# Patient Record
Sex: Female | Born: 1980
Health system: Southern US, Community
[De-identification: ages and names within clinical notes are randomized; demographics above are authoritative.]

## PROBLEM LIST (undated history)

## (undated) DIAGNOSIS — E7800 Pure hypercholesterolemia, unspecified: Secondary | ICD-10-CM

## (undated) DIAGNOSIS — G43909 Migraine, unspecified, not intractable, without status migrainosus: Secondary | ICD-10-CM

## (undated) DIAGNOSIS — M779 Enthesopathy, unspecified: Secondary | ICD-10-CM

## (undated) DIAGNOSIS — K219 Gastro-esophageal reflux disease without esophagitis: Secondary | ICD-10-CM

## (undated) DIAGNOSIS — F419 Anxiety disorder, unspecified: Secondary | ICD-10-CM

## (undated) HISTORY — PX: CHOLECYSTECTOMY: SHX55

## (undated) HISTORY — PX: TUBAL LIGATION: SHX77

---

## 2012-10-15 ENCOUNTER — Ambulatory Visit (INDEPENDENT_AMBULATORY_CARE_PROVIDER_SITE_OTHER): Payer: Self-pay

## 2012-10-15 ENCOUNTER — Ambulatory Visit (INDEPENDENT_AMBULATORY_CARE_PROVIDER_SITE_OTHER): Payer: BC Managed Care – PPO | Admitting: Neurology

## 2012-10-15 DIAGNOSIS — R209 Unspecified disturbances of skin sensation: Secondary | ICD-10-CM

## 2012-10-15 DIAGNOSIS — R202 Paresthesia of skin: Secondary | ICD-10-CM | POA: Insufficient documentation

## 2012-10-15 NOTE — Procedures (Signed)
    [facility]  NCS (NERVE CONDUCTION STUDY) WITH EMG (ELECTROMYOGRAPHY) REPORT   STUDY DATE: Sept 25th 2014 PATIENT NAME: Samantha Fox DOB: 10-22-1980 MRN: [id number]    TECHNOLOGIST: Gearldine Shown  ELECTROMYOGRAPHER: Levert Feinstein M.D.  CLINICAL INFORMATION:  32 years old right-handed Caucasian female, presenting with 2 years history of intermittent right arm, bilateral lower extremity paresthesia.  FINDINGS: NERVE CONDUCTION STUDY: Bilateral ulnar, median sensory and motor responses were normal. Right peroneal to EDB, tibial motor responses were normal.  Right peroneal sensory response was normal.  NEEDLE ELECTROMYOGRAPHY:   Selected needle examination was performed at right upper and lower extremity muscles, right cervical, lumbosacral paraspinal muscles.  Needle examination of right extensor digitorum communis, biceps, triceps, deltoid was normal. There was no spontaneous activity at the right cervical paraspinal muscles, right C5, 6, 7.  Selected needle examination of right lower extremity muscles, right tibialis anterior, tibialis posterior, medial gastrocnemius, vastus lateralis, biceps femoris long head was normal   There was no spontaneous activity at right lumbosacral paraspinal muscles, right L4, L5, S1   IMPRESSION:   This is a normal study. There is no electrodiagnostic evidence of length dependent sensory changes, there is no evidence of right cervical, or right lumbosacral radiculopathy. There is no evidence of myopathy    INTERPRETING PHYSICIAN:   Levert Feinstein M.D. Ph.D. [facility] [address] [place], [place] [address] [phone]

## 2013-03-05 DIAGNOSIS — G8929 Other chronic pain: Secondary | ICD-10-CM | POA: Insufficient documentation

## 2013-03-05 DIAGNOSIS — R9082 White matter disease, unspecified: Secondary | ICD-10-CM | POA: Insufficient documentation

## 2015-05-18 ENCOUNTER — Emergency Department (HOSPITAL_COMMUNITY)
Admission: EM | Admit: 2015-05-18 | Discharge: 2015-05-19 | Disposition: A | Discharge status: Home / Self Care | Payer: 59 | Attending: Emergency Medicine | Admitting: Emergency Medicine

## 2015-05-18 ENCOUNTER — Encounter (HOSPITAL_COMMUNITY): Payer: Self-pay | Admitting: Emergency Medicine

## 2015-05-18 DIAGNOSIS — R112 Nausea with vomiting, unspecified: Secondary | ICD-10-CM | POA: Diagnosis not present

## 2015-05-18 DIAGNOSIS — R52 Pain, unspecified: Secondary | ICD-10-CM

## 2015-05-18 DIAGNOSIS — R1031 Right lower quadrant pain: Secondary | ICD-10-CM | POA: Insufficient documentation

## 2015-05-18 HISTORY — DX: Anxiety disorder, unspecified: F41.9

## 2015-05-18 HISTORY — DX: Enthesopathy, unspecified: M77.9

## 2015-05-18 HISTORY — DX: Gastro-esophageal reflux disease without esophagitis: K21.9

## 2015-05-18 HISTORY — DX: Pure hypercholesterolemia, unspecified: E78.00

## 2015-05-18 HISTORY — DX: Migraine, unspecified, not intractable, without status migrainosus: G43.909

## 2015-05-18 LAB — URINALYSIS, ROUTINE W REFLEX MICROSCOPIC
Bilirubin Urine: NEGATIVE
Glucose, UA: NEGATIVE mg/dL
Hgb urine dipstick: NEGATIVE
LEUKOCYTES UA: NEGATIVE
NITRITE: NEGATIVE
PH: 6 (ref 5.0–8.0)
Protein, ur: NEGATIVE mg/dL
Specific Gravity, Urine: <1.005 — ABNORMAL LOW (ref 1.005–1.030)

## 2015-05-18 LAB — CBC
HEMATOCRIT: 39.6 % (ref 36.0–46.0)
HEMOGLOBIN: 13.3 g/dL (ref 12.0–15.0)
MCH: 29.2 pg (ref 26.0–34.0)
MCHC: 33.6 g/dL (ref 30.0–36.0)
MCV: 87 fL (ref 78.0–100.0)
Platelets: 359 10*3/uL (ref 150–400)
RBC: 4.55 MIL/uL (ref 3.87–5.11)
RDW: 12.6 % (ref 11.5–15.5)
WBC: 12.3 10*3/uL — AB (ref 4.0–10.5)

## 2015-05-18 LAB — POC URINE PREG, ED: Preg Test, Ur: NEGATIVE

## 2015-05-18 NOTE — ED Notes (Signed)
Patient complaining of lower abdominal pain radiating into her back and vomiting starting at 1800 tonight.

## 2015-05-19 ENCOUNTER — Emergency Department (HOSPITAL_COMMUNITY): Payer: 59

## 2015-05-19 LAB — COMPREHENSIVE METABOLIC PANEL
ALT: 22 U/L (ref 14–54)
ANION GAP: 12 (ref 5–15)
AST: 26 U/L (ref 15–41)
Albumin: 4.7 g/dL (ref 3.5–5.0)
Alkaline Phosphatase: 80 U/L (ref 38–126)
BILIRUBIN TOTAL: 0.5 mg/dL (ref 0.3–1.2)
BUN: 7 mg/dL (ref 6–20)
CO2: 27 mmol/L (ref 22–32)
Calcium: 9.1 mg/dL (ref 8.9–10.3)
Chloride: 99 mmol/L — ABNORMAL LOW (ref 101–111)
Creatinine, Ser: 0.88 mg/dL (ref 0.44–1.00)
Glucose, Bld: 101 mg/dL — ABNORMAL HIGH (ref 65–99)
Potassium: 3.3 mmol/L — ABNORMAL LOW (ref 3.5–5.1)
Sodium: 138 mmol/L (ref 135–145)
TOTAL PROTEIN: 7.7 g/dL (ref 6.5–8.1)

## 2015-05-19 LAB — LIPASE, BLOOD: Lipase: 20 U/L (ref 11–51)

## 2015-05-19 MED ORDER — ONDANSETRON 8 MG PO TBDP: ORAL_TABLET | ORAL | Status: DC

## 2015-05-19 MED ORDER — IOPAMIDOL (ISOVUE-300) INJECTION 61%
100.0000 mL | Freq: Once | INTRAVENOUS | Status: AC | PRN
  Administered 2015-05-19: 100 mL via INTRAVENOUS

## 2015-05-19 MED ORDER — ONDANSETRON HCL 4 MG/2ML IJ SOLN
4.0000 mg | Freq: Once | INTRAMUSCULAR | Status: AC
  Administered 2015-05-19: 4 mg via INTRAVENOUS
  Filled 2015-05-19: qty 2

## 2015-05-19 MED ORDER — MORPHINE SULFATE (PF) 4 MG/ML IV SOLN
4.0000 mg | Freq: Once | INTRAVENOUS | Status: AC
  Administered 2015-05-19: 4 mg via INTRAVENOUS
  Filled 2015-05-19: qty 1

## 2015-05-19 NOTE — Discharge Instructions (Signed)

## 2015-05-19 NOTE — ED Provider Notes (Signed)
CSN: [id number]     Arrival date & time 05/18/15  2250 History  By signing my name below, I, Tanda Rockers, attest that this documentation has been prepared under the direction and in the presence of Zadie Rhine, MD. Electronically Signed: Tanda Rockers, ED Scribe. 05/19/2015. 12:46 AM.   Chief Complaint  Patient presents with  . Abdominal Pain  . Emesis   Patient is a 35 y.o. female presenting with abdominal pain and vomiting. The history is provided by the patient. No language interpreter was used.  Abdominal Pain Pain location:  LLQ and RLQ Pain radiates to:  Back Pain severity:  Moderate Onset quality:  Gradual Duration:  7 hours Timing:  Constant Progression:  Worsening Chronicity:  New Relieved by:  None tried Worsened by:  Nothing tried Ineffective treatments:  None tried Associated symptoms: nausea and vomiting   Associated symptoms: no chest pain, no cough, no diarrhea, no dysuria, no fever, no shortness of breath, no vaginal bleeding and no vaginal discharge   Emesis Associated symptoms: abdominal pain   Associated symptoms: no diarrhea      HPI Comments: Samantha Fox is a 35 y.o. female who presents to the Emergency Department complaining of gradual onset, constant, lower abdominal pain, worse on right, radiating to back that began earlier tonight around 6 PM (approximately 7 hours ago), gradually worsening. Pt also complains of nausea and vomiting. She has never had symptoms like this in the past. No previous issues with ovaries. PSHx cholecystectomy.  Denies fever, diarrhea, dysuria, vaginal bleeding, vaginal discharge, chest pain, cough, shortness of breath, or any other associated symptoms.    She has no concerns for any exposure to STD Past Medical History  Diagnosis Date  . Anxiety   . Migraines   . High cholesterol   . Acid reflux   . Tendonitis    Past Surgical History  Procedure Laterality Date  . Cholecystectomy    . Tubal ligation      History reviewed. No pertinent family history. Social History  Substance Use Topics  . Smoking status: Never Smoker   . Smokeless tobacco: None  . Alcohol Use: No   OB History    No data available     Review of Systems  Constitutional: Negative for fever.  Respiratory: Negative for cough and shortness of breath.   Cardiovascular: Negative for chest pain.  Gastrointestinal: Positive for nausea, vomiting and abdominal pain. Negative for diarrhea.  Genitourinary: Negative for dysuria, vaginal bleeding, vaginal discharge and dyspareunia.  All other systems reviewed and are negative.   Allergies  Sulfa antibiotics  Home Medications   Prior to Admission medications   Medication Sig Start Date End Date Taking? Authorizing Provider  citalopram (CELEXA) 20 MG tablet Take 20 mg by mouth daily.   Yes Historical Provider, MD  cyclobenzaprine (FLEXERIL) 10 MG tablet Take 10 mg by mouth daily.   Yes Historical Provider, MD  esomeprazole (NEXIUM) 40 MG capsule Take 40 mg by mouth daily at 12 noon.   Yes Historical Provider, MD  naproxen (NAPROSYN) 500 MG tablet Take 500 mg by mouth daily.   Yes Historical Provider, MD  nortriptyline (PAMELOR) 10 MG capsule Take 40 mg by mouth at bedtime.   Yes Historical Provider, MD  simvastatin (ZOCOR) 20 MG tablet Take 20 mg by mouth daily.   Yes Historical Provider, MD   BP 126/81 mmHg  Pulse 105  Temp(Src) 98.3 F (36.8 C) (Oral)  Resp 16  Ht  (1.575 m)  Wt 162 lb (73.483 kg)  BMI 29.62 kg/m2  SpO2 100%  LMP 05/08/2015   Physical Exam  Nursing note and vitals reviewed.    CONSTITUTIONAL: Well developed/well nourished HEAD: Normocephalic/atraumatic EYES: EOMI/PERRL ENMT: Mucous membranes moist NECK: supple no meningeal signs SPINE/BACK:entire spine nontender CV: S1/S2 noted, no murmurs/rubs/gallops noted LUNGS: Lungs are clear to auscultation bilaterally, no apparent distress ABDOMEN: soft,moderate LLQ and RLQ tenderness, no  rebound or guarding, bowel sounds noted throughout abdomen GU:no cva tenderness Pelvic - no cmt.  No vaginal bleeding/discharge.  Right adnexal tenderness.  No adnexal mass noted Female chaperone Angelena present for entire exam NEURO: Pt is awake/alert/appropriate, moves all extremitiesx4.  No facial droop.   EXTREMITIES: pulses normal/equal, full ROM SKIN: warm, color normal PSYCH: no abnormalities of mood noted, alert and oriented to situation  ED Course  Procedures   DIAGNOSTIC STUDIES: Oxygen Saturation is 100% on RA, normal by my interpretation.    COORDINATION OF CARE: 12:45 AM-Discussed treatment plan which includes CT A/P with pt at bedside and pt agreed to plan.   3:51 AM I was concerned for acute appendicitis given history/exam and leukocytosis Her CT scan was negative for appendicitis After pelvic exam, concern for acute pelvic pathology Emergent pelvic US did not reveal signs of acute TOA/Torsion She is appropriate for d/c home and f/u as outpatient BP 126/81 mmHg  Pulse 105  Temp(Src) 98.3 F (36.8 C) (Oral)  Resp 16  Ht 5\' 2"  (1.575 m)  Wt 73.483 kg  BMI 29.62 kg/m2  SpO2 100%  LMP 05/08/2015   Labs Review Labs Reviewed  COMPREHENSIVE METABOLIC PANEL - Abnormal; Notable for the following:    Potassium 3.3 (*)    Chloride 99 (*)    Glucose, Bld 101 (*)    All other components within normal limits  CBC - Abnormal; Notable for the following:    WBC 12.3 (*)    All other components within normal limits  URINALYSIS, ROUTINE W REFLEX MICROSCOPIC (NOT AT [facility]) - Abnormal; Notable for the following:    Specific Gravity, Urine <1.005 (*)    Ketones, ur TRACE (*)    All other components within normal limits  LIPASE, BLOOD  POC URINE PREG, ED    Imaging Review US Transvaginal Non-ob  05/19/2015  CLINICAL DATA:  Acute onset of right-sided pelvic pain. Initial encounter. EXAM: TRANSABDOMINAL AND TRANSVAGINAL ULTRASOUND OF PELVIS DOPPLER ULTRASOUND OF OVARIES  TECHNIQUE: Both transabdominal and transvaginal ultrasound examinations of the pelvis were performed. Transabdominal technique was performed for global imaging of the pelvis including uterus, ovaries, adnexal regions, and pelvic cul-de-sac. It was necessary to proceed with endovaginal exam following the transabdominal exam to visualize the right ovary. Color and duplex Doppler ultrasound was utilized to evaluate blood flow to the ovaries. COMPARISON:  None. FINDINGS: Uterus Measurements: 9.0 x 5.0 x 7.4 cm. No fibroids or other mass visualized. Endometrium Thickness: 0.8 cm.  No focal abnormality visualized. Right ovary Not visualized due to overlying bowel loops. Left ovary Measurements: 3.4 x 2.7 x 2.7 cm. A mildly complex 2.2 cm cyst is noted at the left ovary. Pulsed Doppler evaluation of both ovaries demonstrates normal low-resistance arterial and venous waveforms. Other findings No abnormal free fluid. IMPRESSION: 1. Uterus unremarkable in appearance. 2. Right ovary not visualized due to overlying bowel loops. Electronically Signed   By: Roanna Raider M.D.   On: 05/19/2015 03:43   US Pelvis Complete  05/19/2015  CLINICAL DATA:  Acute onset of right-sided pelvic pain. Initial  encounter. EXAM: TRANSABDOMINAL AND TRANSVAGINAL ULTRASOUND OF PELVIS DOPPLER ULTRASOUND OF OVARIES TECHNIQUE: Both transabdominal and transvaginal ultrasound examinations of the pelvis were performed. Transabdominal technique was performed for global imaging of the pelvis including uterus, ovaries, adnexal regions, and pelvic cul-de-sac. It was necessary to proceed with endovaginal exam following the transabdominal exam to visualize the right ovary. Color and duplex Doppler ultrasound was utilized to evaluate blood flow to the ovaries. COMPARISON:  None. FINDINGS: Uterus Measurements: 9.0 x 5.0 x 7.4 cm. No fibroids or other mass visualized. Endometrium Thickness: 0.8 cm.  No focal abnormality visualized. Right ovary Not visualized  due to overlying bowel loops. Left ovary Measurements: 3.4 x 2.7 x 2.7 cm. A mildly complex 2.2 cm cyst is noted at the left ovary. Pulsed Doppler evaluation of both ovaries demonstrates normal low-resistance arterial and venous waveforms. Other findings No abnormal free fluid. IMPRESSION: 1. Uterus unremarkable in appearance. 2. Right ovary not visualized due to overlying bowel loops. Electronically Signed   By: Roanna Raider M.D.   On: 05/19/2015 03:43   Ct Abdomen Pelvis W Contrast  05/19/2015  CLINICAL DATA:  Acute onset of lower abdominal pain, worse on the right, radiating to the back. Nausea and vomiting. Initial encounter. EXAM: CT ABDOMEN AND PELVIS WITH CONTRAST TECHNIQUE: Multidetector CT imaging of the abdomen and pelvis was performed using the standard protocol following bolus administration of intravenous contrast. CONTRAST:  ISOVUE-300 IOPAMIDOL (ISOVUE-300) INJECTION 61% COMPARISON:  None. FINDINGS: The visualized lung bases are clear. The liver and spleen are unremarkable in appearance. The patient is status post cholecystectomy, with clips noted at the gallbladder fossa. The pancreas and adrenal glands are unremarkable. The kidneys are unremarkable in appearance. There is no evidence of hydronephrosis. No renal or ureteral stones are seen. No perinephric stranding is appreciated. No free fluid is identified. The small bowel is unremarkable in appearance. The stomach is within normal limits. No acute vascular abnormalities are seen. The appendix is normal in caliber, without evidence of appendicitis. The colon is grossly unremarkable in appearance. The bladder is mildly distended and grossly unremarkable the uterus is unremarkable in appearance. The ovaries are relatively symmetric. No suspicious adnexal masses are seen. Both tubal ligation clips are noted at the right adnexa, suggesting displacement of the left-sided tubal ligation clip. No inguinal lymphadenopathy is seen. No acute  osseous abnormalities are identified. IMPRESSION: 1. No acute abnormality seen within the abdomen or pelvis. 2. Both tubal ligation clips are seen at the right adnexa, suggesting displacement of the left-sided tubal ligation clip. Electronically Signed   By: Roanna Raider M.D.   On: 05/19/2015 01:43   [place] Art/ven Flow Abd Pelv Doppler Limited  05/19/2015  CLINICAL DATA:  Acute onset of right-sided pelvic pain. Initial encounter. EXAM: TRANSABDOMINAL AND TRANSVAGINAL ULTRASOUND OF PELVIS DOPPLER ULTRASOUND OF OVARIES TECHNIQUE: Both transabdominal and transvaginal ultrasound examinations of the pelvis were performed. Transabdominal technique was performed for global imaging of the pelvis including uterus, ovaries, adnexal regions, and pelvic cul-de-sac. It was necessary to proceed with endovaginal exam following the transabdominal exam to visualize the right ovary. Color and duplex Doppler ultrasound was utilized to evaluate blood flow to the ovaries. COMPARISON:  None. FINDINGS: Uterus Measurements: 9.0 x 5.0 x 7.4 cm. No fibroids or other mass visualized. Endometrium Thickness: 0.8 cm.  No focal abnormality visualized. Right ovary Not visualized due to overlying bowel loops. Left ovary Measurements: 3.4 x 2.7 x 2.7 cm. A mildly complex 2.2 cm cyst is noted at the  left ovary. Pulsed Doppler evaluation of both ovaries demonstrates normal low-resistance arterial and venous waveforms. Other findings No abnormal free fluid. IMPRESSION: 1. Uterus unremarkable in appearance. 2. Right ovary not visualized due to overlying bowel loops. Electronically Signed   By: Roanna RaiderJeffery  Chang M.D.   On: 05/19/2015 03:43   I have personally reviewed and evaluated these lab results as part of my medical decision-making.    MDM   Final diagnoses:  Pain  Right lower quadrant abdominal pain    Nursing notes including past medical history and social history reviewed and considered in documentation Labs/vital reviewed myself and  considered during evaluation   I personally performed the services described in this documentation, which was scribed in my presence. The recorded information has been reviewed and is accurate.       Zadie Rhineonald Jaidyn Usery, MD 05/19/15 [phone]

## 2015-05-19 NOTE — ED Notes (Signed)
Pt alert & oriented x4, stable gait. Patient given discharge instructions, paperwork & prescription(s). Patient  instructed to stop at the registration desk to finish any additional paperwork. Patient verbalized understanding. Pt left department w/ no further questions. 

## 2015-07-10 ENCOUNTER — Ambulatory Visit (INDEPENDENT_AMBULATORY_CARE_PROVIDER_SITE_OTHER): Payer: 59 | Admitting: Otolaryngology

## 2015-07-10 DIAGNOSIS — R070 Pain in throat: Secondary | ICD-10-CM

## 2015-07-10 DIAGNOSIS — R490 Dysphonia: Secondary | ICD-10-CM

## 2015-07-26 DIAGNOSIS — Z6828 Body mass index (BMI) 28.0-28.9, adult: Secondary | ICD-10-CM | POA: Diagnosis not present

## 2015-07-26 DIAGNOSIS — M545 Low back pain: Secondary | ICD-10-CM | POA: Diagnosis not present

## 2015-08-21 ENCOUNTER — Ambulatory Visit (INDEPENDENT_AMBULATORY_CARE_PROVIDER_SITE_OTHER): Payer: 59 | Admitting: Otolaryngology

## 2015-09-04 DIAGNOSIS — Z6828 Body mass index (BMI) 28.0-28.9, adult: Secondary | ICD-10-CM | POA: Diagnosis not present

## 2015-09-04 DIAGNOSIS — R300 Dysuria: Secondary | ICD-10-CM | POA: Diagnosis not present

## 2015-09-21 DIAGNOSIS — Z6828 Body mass index (BMI) 28.0-28.9, adult: Secondary | ICD-10-CM | POA: Diagnosis not present

## 2015-09-21 DIAGNOSIS — J029 Acute pharyngitis, unspecified: Secondary | ICD-10-CM | POA: Diagnosis not present

## 2015-09-29 DIAGNOSIS — F329 Major depressive disorder, single episode, unspecified: Secondary | ICD-10-CM | POA: Diagnosis not present

## 2015-09-29 DIAGNOSIS — K5900 Constipation, unspecified: Secondary | ICD-10-CM | POA: Diagnosis not present

## 2015-09-29 DIAGNOSIS — E782 Mixed hyperlipidemia: Secondary | ICD-10-CM | POA: Diagnosis not present

## 2015-09-29 DIAGNOSIS — G43909 Migraine, unspecified, not intractable, without status migrainosus: Secondary | ICD-10-CM | POA: Diagnosis not present

## 2015-09-29 DIAGNOSIS — Z1389 Encounter for screening for other disorder: Secondary | ICD-10-CM | POA: Diagnosis not present

## 2015-10-24 DIAGNOSIS — Z23 Encounter for immunization: Secondary | ICD-10-CM | POA: Diagnosis not present

## 2016-01-30 DIAGNOSIS — J029 Acute pharyngitis, unspecified: Secondary | ICD-10-CM | POA: Diagnosis not present

## 2016-03-04 DIAGNOSIS — J020 Streptococcal pharyngitis: Secondary | ICD-10-CM | POA: Diagnosis not present

## 2016-03-19 DIAGNOSIS — Z6828 Body mass index (BMI) 28.0-28.9, adult: Secondary | ICD-10-CM | POA: Diagnosis not present

## 2016-03-19 DIAGNOSIS — J020 Streptococcal pharyngitis: Secondary | ICD-10-CM | POA: Diagnosis not present

## 2016-03-27 DIAGNOSIS — R300 Dysuria: Secondary | ICD-10-CM | POA: Diagnosis not present

## 2016-03-27 DIAGNOSIS — Z6828 Body mass index (BMI) 28.0-28.9, adult: Secondary | ICD-10-CM | POA: Diagnosis not present

## 2016-03-27 DIAGNOSIS — N926 Irregular menstruation, unspecified: Secondary | ICD-10-CM | POA: Diagnosis not present

## 2016-04-05 DIAGNOSIS — Z6828 Body mass index (BMI) 28.0-28.9, adult: Secondary | ICD-10-CM | POA: Diagnosis not present

## 2016-04-05 DIAGNOSIS — J029 Acute pharyngitis, unspecified: Secondary | ICD-10-CM | POA: Diagnosis not present

## 2016-05-13 DIAGNOSIS — L700 Acne vulgaris: Secondary | ICD-10-CM | POA: Diagnosis not present

## 2016-05-13 DIAGNOSIS — L570 Actinic keratosis: Secondary | ICD-10-CM | POA: Diagnosis not present

## 2016-06-20 DIAGNOSIS — F329 Major depressive disorder, single episode, unspecified: Secondary | ICD-10-CM | POA: Diagnosis not present

## 2016-06-20 DIAGNOSIS — E782 Mixed hyperlipidemia: Secondary | ICD-10-CM | POA: Diagnosis not present

## 2016-06-20 DIAGNOSIS — R5383 Other fatigue: Secondary | ICD-10-CM | POA: Diagnosis not present

## 2016-06-20 DIAGNOSIS — K219 Gastro-esophageal reflux disease without esophagitis: Secondary | ICD-10-CM | POA: Diagnosis not present

## 2016-06-24 DIAGNOSIS — R002 Palpitations: Secondary | ICD-10-CM | POA: Diagnosis not present

## 2016-06-24 DIAGNOSIS — G43909 Migraine, unspecified, not intractable, without status migrainosus: Secondary | ICD-10-CM | POA: Diagnosis not present

## 2016-06-24 DIAGNOSIS — E782 Mixed hyperlipidemia: Secondary | ICD-10-CM | POA: Diagnosis not present

## 2016-06-24 DIAGNOSIS — F329 Major depressive disorder, single episode, unspecified: Secondary | ICD-10-CM | POA: Diagnosis not present

## 2016-07-22 DIAGNOSIS — L700 Acne vulgaris: Secondary | ICD-10-CM | POA: Diagnosis not present

## 2016-07-22 DIAGNOSIS — D2271 Melanocytic nevi of right lower limb, including hip: Secondary | ICD-10-CM | POA: Diagnosis not present

## 2016-07-22 DIAGNOSIS — L570 Actinic keratosis: Secondary | ICD-10-CM | POA: Diagnosis not present

## 2016-07-22 DIAGNOSIS — D485 Neoplasm of uncertain behavior of skin: Secondary | ICD-10-CM | POA: Diagnosis not present

## 2016-08-28 DIAGNOSIS — J020 Streptococcal pharyngitis: Secondary | ICD-10-CM | POA: Diagnosis not present

## 2016-09-03 DIAGNOSIS — E782 Mixed hyperlipidemia: Secondary | ICD-10-CM | POA: Diagnosis not present

## 2016-09-03 DIAGNOSIS — Z0000 Encounter for general adult medical examination without abnormal findings: Secondary | ICD-10-CM | POA: Diagnosis not present

## 2016-09-03 DIAGNOSIS — F329 Major depressive disorder, single episode, unspecified: Secondary | ICD-10-CM | POA: Diagnosis not present

## 2016-09-03 DIAGNOSIS — Z6826 Body mass index (BMI) 26.0-26.9, adult: Secondary | ICD-10-CM | POA: Diagnosis not present

## 2016-09-03 DIAGNOSIS — K219 Gastro-esophageal reflux disease without esophagitis: Secondary | ICD-10-CM | POA: Diagnosis not present

## 2016-09-03 DIAGNOSIS — Z23 Encounter for immunization: Secondary | ICD-10-CM | POA: Diagnosis not present

## 2016-09-09 DIAGNOSIS — R102 Pelvic and perineal pain: Secondary | ICD-10-CM | POA: Diagnosis not present

## 2016-09-09 DIAGNOSIS — R938 Abnormal findings on diagnostic imaging of other specified body structures: Secondary | ICD-10-CM | POA: Diagnosis not present

## 2016-10-01 DIAGNOSIS — J029 Acute pharyngitis, unspecified: Secondary | ICD-10-CM | POA: Diagnosis not present

## 2016-10-01 DIAGNOSIS — R509 Fever, unspecified: Secondary | ICD-10-CM | POA: Diagnosis not present

## 2016-10-01 DIAGNOSIS — R300 Dysuria: Secondary | ICD-10-CM | POA: Diagnosis not present

## 2016-10-01 DIAGNOSIS — Z6827 Body mass index (BMI) 27.0-27.9, adult: Secondary | ICD-10-CM | POA: Diagnosis not present

## 2016-11-06 DIAGNOSIS — J029 Acute pharyngitis, unspecified: Secondary | ICD-10-CM | POA: Diagnosis not present

## 2016-11-06 DIAGNOSIS — Z6827 Body mass index (BMI) 27.0-27.9, adult: Secondary | ICD-10-CM | POA: Diagnosis not present

## 2016-12-10 DIAGNOSIS — L709 Acne, unspecified: Secondary | ICD-10-CM | POA: Diagnosis not present

## 2016-12-10 DIAGNOSIS — Z79899 Other long term (current) drug therapy: Secondary | ICD-10-CM | POA: Diagnosis not present

## 2016-12-23 DIAGNOSIS — Z6827 Body mass index (BMI) 27.0-27.9, adult: Secondary | ICD-10-CM | POA: Diagnosis not present

## 2016-12-23 DIAGNOSIS — J019 Acute sinusitis, unspecified: Secondary | ICD-10-CM | POA: Diagnosis not present

## 2016-12-23 DIAGNOSIS — J029 Acute pharyngitis, unspecified: Secondary | ICD-10-CM | POA: Diagnosis not present

## 2017-01-31 DIAGNOSIS — R300 Dysuria: Secondary | ICD-10-CM | POA: Diagnosis not present

## 2017-01-31 DIAGNOSIS — N390 Urinary tract infection, site not specified: Secondary | ICD-10-CM | POA: Diagnosis not present

## 2017-01-31 DIAGNOSIS — Z6826 Body mass index (BMI) 26.0-26.9, adult: Secondary | ICD-10-CM | POA: Diagnosis not present

## 2017-02-14 DIAGNOSIS — Z6826 Body mass index (BMI) 26.0-26.9, adult: Secondary | ICD-10-CM | POA: Diagnosis not present

## 2017-02-14 DIAGNOSIS — K219 Gastro-esophageal reflux disease without esophagitis: Secondary | ICD-10-CM | POA: Diagnosis not present

## 2017-03-03 DIAGNOSIS — K219 Gastro-esophageal reflux disease without esophagitis: Secondary | ICD-10-CM | POA: Diagnosis not present

## 2017-03-03 DIAGNOSIS — Z6827 Body mass index (BMI) 27.0-27.9, adult: Secondary | ICD-10-CM | POA: Diagnosis not present

## 2017-03-06 DIAGNOSIS — D485 Neoplasm of uncertain behavior of skin: Secondary | ICD-10-CM | POA: Diagnosis not present

## 2017-03-24 DIAGNOSIS — L700 Acne vulgaris: Secondary | ICD-10-CM | POA: Diagnosis not present

## 2017-03-24 DIAGNOSIS — Z79899 Other long term (current) drug therapy: Secondary | ICD-10-CM | POA: Diagnosis not present

## 2017-04-16 DIAGNOSIS — Z6827 Body mass index (BMI) 27.0-27.9, adult: Secondary | ICD-10-CM | POA: Diagnosis not present

## 2017-04-16 DIAGNOSIS — R1011 Right upper quadrant pain: Secondary | ICD-10-CM | POA: Diagnosis not present

## 2017-04-24 DIAGNOSIS — R1011 Right upper quadrant pain: Secondary | ICD-10-CM | POA: Diagnosis not present

## 2017-04-29 DIAGNOSIS — K5900 Constipation, unspecified: Secondary | ICD-10-CM | POA: Diagnosis not present

## 2017-04-29 DIAGNOSIS — R1011 Right upper quadrant pain: Secondary | ICD-10-CM | POA: Diagnosis not present

## 2017-04-29 DIAGNOSIS — R197 Diarrhea, unspecified: Secondary | ICD-10-CM | POA: Diagnosis not present

## 2017-04-29 DIAGNOSIS — Z01411 Encounter for gynecological examination (general) (routine) with abnormal findings: Secondary | ICD-10-CM | POA: Diagnosis not present

## 2017-04-29 DIAGNOSIS — Z6827 Body mass index (BMI) 27.0-27.9, adult: Secondary | ICD-10-CM | POA: Diagnosis not present

## 2017-06-11 DIAGNOSIS — Z6827 Body mass index (BMI) 27.0-27.9, adult: Secondary | ICD-10-CM | POA: Diagnosis not present

## 2017-06-11 DIAGNOSIS — R002 Palpitations: Secondary | ICD-10-CM | POA: Diagnosis not present

## 2017-06-20 ENCOUNTER — Ambulatory Visit (INDEPENDENT_AMBULATORY_CARE_PROVIDER_SITE_OTHER): Payer: BLUE CROSS/BLUE SHIELD | Admitting: Cardiology

## 2017-06-20 ENCOUNTER — Encounter: Payer: Self-pay | Admitting: Cardiology

## 2017-06-20 VITALS — BP 105/71 | HR 96 | Ht 63.0 in | Wt 153.0 lb

## 2017-06-20 DIAGNOSIS — R002 Palpitations: Secondary | ICD-10-CM

## 2017-06-20 NOTE — Progress Notes (Signed)
Clinical Summary Samantha Fox is a 37 y.o.female seen as new consult, referred by Dr Dimas AguasHoward for palpitations.    1. Palpitations - off and on for several years. Feeling of heart pounding, can feel lightheaded. Can have some SOB - increased frequency over the last month.  - can occur rest or with activity. Can last up to 5 minutes. - no coffee, no sodas, occasional tea, no energy drinks, no EtOH. - pcp shows SR with PVCs - K 4.8  - rides bike two times a week x 30min to 1 hr, tolerates well   Past Medical History:  Diagnosis Date  . Acid reflux   . Anxiety   . High cholesterol   . Migraines   . Tendonitis      Allergies  Allergen Reactions  . Sulfa Antibiotics Hives     Current Outpatient Medications  Medication Sig Dispense Refill  . citalopram (CELEXA) 20 MG tablet Take 20 mg by mouth daily.    . cyclobenzaprine (FLEXERIL) 10 MG tablet Take 10 mg by mouth daily.    Marland Kitchen. esomeprazole (NEXIUM) 40 MG capsule Take 40 mg by mouth daily at 12 noon.    . naproxen (NAPROSYN) 500 MG tablet Take 500 mg by mouth daily.    . nortriptyline (PAMELOR) 10 MG capsule Take 40 mg by mouth at bedtime.    . ondansetron (ZOFRAN ODT) 8 MG disintegrating tablet 8mg  ODT q8 hours prn nausea 4 tablet 0  . simvastatin (ZOCOR) 20 MG tablet Take 20 mg by mouth daily.     No current facility-administered medications for this visit.      Past Surgical History:  Procedure Laterality Date  . CHOLECYSTECTOMY    . TUBAL LIGATION       Allergies  Allergen Reactions  . Sulfa Antibiotics Hives      No family history on file.   Social History Samantha Fox reports that she has never smoked. She does not have any smokeless tobacco history on file. Samantha Fox reports that she does not drink alcohol.   Review of Systems CONSTITUTIONAL: No weight loss, fever, chills, weakness or fatigue.  HEENT: Eyes: No visual loss, blurred vision, double vision or yellow sclerae.No hearing loss,  sneezing, congestion, runny nose or sore throat.  SKIN: No rash or itching.  CARDIOVASCULAR: per hpi RESPIRATORY: No shortness of breath, cough or sputum.  GASTROINTESTINAL: No anorexia, nausea, vomiting or diarrhea. No abdominal pain or blood.  GENITOURINARY: No burning on urination, no polyuria NEUROLOGICAL: No headache, dizziness, syncope, paralysis, ataxia, numbness or tingling in the extremities. No change in bowel or bladder control.  MUSCULOSKELETAL: No muscle, back pain, joint pain or stiffness.  LYMPHATICS: No enlarged nodes. No history of splenectomy.  PSYCHIATRIC: No history of depression or anxiety.  ENDOCRINOLOGIC: No reports of sweating, cold or heat intolerance. No polyuria or polydipsia.  Marland Kitchen.   Physical Examination Vitals:   06/20/17 1011 06/20/17 1017  BP: 111/78 105/71  Pulse: (!) 110 96  SpO2: 100%    Vitals:   06/20/17 1011  Weight: 153 lb (69.4 kg)  Height: 5\' 3"  (1.6 m)    Gen: resting comfortably, no acute distress HEENT: no scleral icterus, pupils equal round and reactive, no palptable cervical adenopathy,  CV: RRR, no m/r/g, no jvd Resp: Clear to auscultation bilaterally GI: abdomen is soft, non-tender, non-distended, normal bowel sounds, no hepatosplenomegaly MSK: extremities are warm, no edema.  Skin: warm, no rash Neuro:  no focal deficits Psych: appropriate affect  Assessment and Plan  1. Palpitations - EKG in clinic today shows mild sinus tach at 103 - we will paln for 24 hr holter monitor to further evaluate   F/u pending holter results.      Antoine Poche, M.D.

## 2017-06-20 NOTE — Patient Instructions (Signed)
Your physician recommends that you schedule a follow-up appointment in: 1 MONTH WITH DR. BRANCH   Your physician recommends that you continue on your current medications as directed. Please refer to the Current Medication list given to you today.  Your physician has recommended that you wear a holter monitor FOR 24 HOURS. Holter monitors are medical devices that record the heart's electrical activity. Doctors most often use these monitors to diagnose arrhythmias. Arrhythmias are problems with the speed or rhythm of the heartbeat. The monitor is a small, portable device. You can wear one while you do your normal daily activities. This is usually used to diagnose what is causing palpitations/syncope (passing out).  Thank you for choosing [facility]!!      

## 2017-06-25 ENCOUNTER — Telehealth: Payer: Self-pay | Admitting: Cardiovascular Disease

## 2017-06-25 ENCOUNTER — Encounter (INDEPENDENT_AMBULATORY_CARE_PROVIDER_SITE_OTHER): Payer: BLUE CROSS/BLUE SHIELD | Admitting: Cardiology

## 2017-06-25 DIAGNOSIS — R002 Palpitations: Secondary | ICD-10-CM

## 2017-06-25 NOTE — Progress Notes (Unsigned)
Patient in office for 24 hour holter placement.

## 2017-06-25 NOTE — Telephone Encounter (Signed)
Pre-cert Verification for the following procedure    BC/BS - 48 hour holter for palpitations.

## 2017-07-02 ENCOUNTER — Encounter: Payer: Self-pay | Admitting: Cardiology

## 2017-07-03 ENCOUNTER — Encounter: Payer: Self-pay | Admitting: Cardiology

## 2017-07-10 ENCOUNTER — Telehealth: Payer: Self-pay

## 2017-07-10 NOTE — Telephone Encounter (Signed)
-----   Message from Antoine PocheJonathan F Branch, MD sent at 07/08/2017  2:50 PM EDT ----- Heart monitor overall shows just some occasional extra heart beats. This is nothing dangerous but can cause some symptoms of heart skipping. If symptoms are bad enough we sometimes try staring a medication. Helps to limtit caffeine and alcohol. Will discuss in further detail at f/u, decide if we need to try a medication at that time  J BrancH MD

## 2017-07-10 NOTE — Telephone Encounter (Signed)
Patient notified. Routed to PCP 

## 2017-07-22 ENCOUNTER — Other Ambulatory Visit: Payer: Self-pay

## 2017-07-22 ENCOUNTER — Ambulatory Visit (INDEPENDENT_AMBULATORY_CARE_PROVIDER_SITE_OTHER): Payer: BLUE CROSS/BLUE SHIELD | Admitting: Cardiology

## 2017-07-22 ENCOUNTER — Encounter: Payer: Self-pay | Admitting: Cardiology

## 2017-07-22 VITALS — BP 128/88 | HR 127 | Ht 64.0 in | Wt 157.0 lb

## 2017-07-22 DIAGNOSIS — R000 Tachycardia, unspecified: Secondary | ICD-10-CM

## 2017-07-22 DIAGNOSIS — R002 Palpitations: Secondary | ICD-10-CM | POA: Diagnosis not present

## 2017-07-22 MED ORDER — METOPROLOL TARTRATE 25 MG PO TABS: 25.0000 mg | ORAL_TABLET | Freq: Two times a day (BID) | ORAL | 1 refills | Status: DC

## 2017-07-22 NOTE — Patient Instructions (Signed)
Your physician wants you to follow-up in: 6 [facility]TH [facility] You will receive a reminder letter in the mail two months in advance. If you don't receive a letter, please call our office to schedule the follow-up appointment.  Your physician has recommended you make the following change in your medication:   START LOPRESSOR 25 MG TWICE DAILY  Thank you for choosing [facility]!!

## 2017-07-22 NOTE — Progress Notes (Signed)
Clinical Summary [name] is a 37 y.o.female seen today for follow up of the following medical problems.   1. Palpitations - off and on for several years. Feeling of heart pounding, can feel lightheaded. Can have some SOB - increased frequency over the last month.  - can occur rest or with activity. Can last up to 5 minutes. - no coffee, no sodas, occasional tea, no energy drinks, no EtOH. - pcp shows SR with PVCs - K 4.8 - rides bike two times a week x 30min to 1 hr, tolerates well  - 06/2017 holter with only occasional PVCs, no significant arrhythmias - still with episodes at times since last visit, overall unchanged. .   Past Medical History:  Diagnosis Date  . Acid reflux   . Anxiety   . High cholesterol   . Migraines   . Tendonitis      Allergies  Allergen Reactions  . Sulfa Antibiotics Hives     Current Outpatient Medications  Medication Sig Dispense Refill  . amoxicillin (AMOXIL) 500 MG capsule TAKE ONE CAPSULE BY MOUTH FOUR TIMES DAILY UNTIL ALL TAKEN  0  . citalopram (CELEXA) 20 MG tablet Take 20 mg by mouth daily.    Marland Kitchen. doxycycline (VIBRA-TABS) 100 MG tablet Take 100 mg by mouth at bedtime. with food  5  . LINZESS 145 MCG CAPS capsule Take 145 mcg by mouth daily.  1  . nortriptyline (PAMELOR) 10 MG capsule Take 40 mg by mouth at bedtime.    . pantoprazole (PROTONIX) 40 MG tablet Take 40 mg by mouth daily.  0  . spironolactone (ALDACTONE) 100 MG tablet Take by mouth at bedtime.     No current facility-administered medications for this visit.      Past Surgical History:  Procedure Laterality Date  . CHOLECYSTECTOMY    . TUBAL LIGATION       Allergies  Allergen Reactions  . Sulfa Antibiotics Hives      No family history on file.   Social History [name] reports that she has never smoked. She has never used smokeless tobacco. [name] reports that she does not drink alcohol.   Review of Systems CONSTITUTIONAL: No weight loss,  fever, chills, weakness or fatigue.  HEENT: Eyes: No visual loss, blurred vision, double vision or yellow sclerae.No hearing loss, sneezing, congestion, runny nose or sore throat.  SKIN: No rash or itching.  CARDIOVASCULAR: per hpi RESPIRATORY: No shortness of breath, cough or sputum.  GASTROINTESTINAL: No anorexia, nausea, vomiting or diarrhea. No abdominal pain or blood.  GENITOURINARY: No burning on urination, no polyuria NEUROLOGICAL: No headache, dizziness, syncope, paralysis, ataxia, numbness or tingling in the extremities. No change in bowel or bladder control.  MUSCULOSKELETAL: No muscle, back pain, joint pain or stiffness.  LYMPHATICS: No enlarged nodes. No history of splenectomy.  PSYCHIATRIC: No history of depression or anxiety.  ENDOCRINOLOGIC: No reports of sweating, cold or heat intolerance. No polyuria or polydipsia.  Marland Kitchen.   Physical Examination Vitals:   07/22/17 0954  BP: 128/88  Pulse: (!) 127  SpO2: 100%   Vitals:   07/22/17 0954  Weight: 157 lb (71.2 kg)  Height: 5\' 4"  (1.626 m)    Gen: resting comfortably, no acute distress HEENT: no scleral icterus, pupils equal round and reactive, no palptable cervical adenopathy,  CV: RRR, no m/r/g, no jvd Resp: Clear to auscultation bilaterally GI: abdomen is soft, non-tender, non-distended, normal bowel sounds, no hepatosplenomegaly MSK: extremities are warm, no edema.  Skin: warm, no rash Neuro:  no focal deficits Psych: appropriate affect   Diagnostic Studies  06/2017 2r h holter  24 hr holter monitor  Min HR 59, Max HR 159, Avg HR 93  No supreventricular ectopy  Occasisional ventricular ectopy, 6% burden. Primarily in the the form of singlets, couplts, bigeminy.  No diary submitted  No significant arrhythmias   Assessment and Plan   1. Palpitations - holter shows some PVCs, no significant arrhythmias. EKG today shows sinus tach.  - ongoing symptoms, will start lopressor 25mg  bid and follow  symptoms   F/u 6 months      Antoine Poche, M.D.

## 2017-07-23 ENCOUNTER — Encounter: Payer: Self-pay | Admitting: *Deleted

## 2017-07-28 ENCOUNTER — Encounter: Payer: Self-pay | Admitting: Cardiology

## 2017-07-30 DIAGNOSIS — Z3202 Encounter for pregnancy test, result negative: Secondary | ICD-10-CM | POA: Diagnosis not present

## 2017-07-30 DIAGNOSIS — R5383 Other fatigue: Secondary | ICD-10-CM | POA: Diagnosis not present

## 2017-07-30 DIAGNOSIS — N912 Amenorrhea, unspecified: Secondary | ICD-10-CM | POA: Diagnosis not present

## 2017-07-30 DIAGNOSIS — R635 Abnormal weight gain: Secondary | ICD-10-CM | POA: Diagnosis not present

## 2017-08-13 DIAGNOSIS — J029 Acute pharyngitis, unspecified: Secondary | ICD-10-CM | POA: Diagnosis not present

## 2017-08-13 DIAGNOSIS — Z6827 Body mass index (BMI) 27.0-27.9, adult: Secondary | ICD-10-CM | POA: Diagnosis not present

## 2017-08-13 DIAGNOSIS — R509 Fever, unspecified: Secondary | ICD-10-CM | POA: Diagnosis not present

## 2017-08-29 DIAGNOSIS — Z6827 Body mass index (BMI) 27.0-27.9, adult: Secondary | ICD-10-CM | POA: Diagnosis not present

## 2017-08-29 DIAGNOSIS — J029 Acute pharyngitis, unspecified: Secondary | ICD-10-CM | POA: Diagnosis not present

## 2017-08-29 DIAGNOSIS — J019 Acute sinusitis, unspecified: Secondary | ICD-10-CM | POA: Diagnosis not present

## 2017-09-23 DIAGNOSIS — L570 Actinic keratosis: Secondary | ICD-10-CM | POA: Diagnosis not present

## 2017-09-23 DIAGNOSIS — L700 Acne vulgaris: Secondary | ICD-10-CM | POA: Diagnosis not present

## 2017-10-14 DIAGNOSIS — R300 Dysuria: Secondary | ICD-10-CM | POA: Diagnosis not present

## 2017-10-14 DIAGNOSIS — N390 Urinary tract infection, site not specified: Secondary | ICD-10-CM | POA: Diagnosis not present

## 2017-10-15 ENCOUNTER — Other Ambulatory Visit: Payer: Self-pay

## 2017-10-15 ENCOUNTER — Emergency Department (HOSPITAL_COMMUNITY)
Admission: EM | Admit: 2017-10-15 | Discharge: 2017-10-15 | Disposition: A | Discharge status: Home / Self Care | Payer: BLUE CROSS/BLUE SHIELD | Attending: Emergency Medicine | Admitting: Emergency Medicine

## 2017-10-15 ENCOUNTER — Emergency Department (HOSPITAL_COMMUNITY): Payer: BLUE CROSS/BLUE SHIELD

## 2017-10-15 ENCOUNTER — Encounter (HOSPITAL_COMMUNITY): Payer: Self-pay

## 2017-10-15 DIAGNOSIS — R112 Nausea with vomiting, unspecified: Secondary | ICD-10-CM | POA: Diagnosis not present

## 2017-10-15 DIAGNOSIS — N939 Abnormal uterine and vaginal bleeding, unspecified: Secondary | ICD-10-CM | POA: Diagnosis not present

## 2017-10-15 DIAGNOSIS — N83292 Other ovarian cyst, left side: Secondary | ICD-10-CM | POA: Diagnosis not present

## 2017-10-15 DIAGNOSIS — Z79899 Other long term (current) drug therapy: Secondary | ICD-10-CM | POA: Diagnosis not present

## 2017-10-15 DIAGNOSIS — R109 Unspecified abdominal pain: Secondary | ICD-10-CM

## 2017-10-15 DIAGNOSIS — R1030 Lower abdominal pain, unspecified: Secondary | ICD-10-CM | POA: Diagnosis not present

## 2017-10-15 LAB — CBC WITH DIFFERENTIAL/PLATELET
BASOS ABS: 0 10*3/uL (ref 0.0–0.1)
BASOS PCT: 0 %
EOS ABS: 0.1 10*3/uL (ref 0.0–0.7)
EOS PCT: 1 %
HEMATOCRIT: 40 % (ref 36.0–46.0)
Hemoglobin: 13.5 g/dL (ref 12.0–15.0)
Lymphocytes Relative: 25 %
Lymphs Abs: 2.4 10*3/uL (ref 0.7–4.0)
MCH: 30.4 pg (ref 26.0–34.0)
MCHC: 33.8 g/dL (ref 30.0–36.0)
MCV: 90.1 fL (ref 78.0–100.0)
MONO ABS: 0.7 10*3/uL (ref 0.1–1.0)
Monocytes Relative: 7 %
Neutro Abs: 6.5 10*3/uL (ref 1.7–7.7)
Neutrophils Relative %: 67 %
PLATELETS: 422 10*3/uL — AB (ref 150–400)
RBC: 4.44 MIL/uL (ref 3.87–5.11)
RDW: 12.3 % (ref 11.5–15.5)
WBC: 9.6 10*3/uL (ref 4.0–10.5)

## 2017-10-15 LAB — URINALYSIS, ROUTINE W REFLEX MICROSCOPIC
Bilirubin Urine: NEGATIVE
Glucose, UA: NEGATIVE mg/dL
KETONES UR: NEGATIVE mg/dL
Nitrite: NEGATIVE
PROTEIN: NEGATIVE mg/dL
Specific Gravity, Urine: 1.002 — ABNORMAL LOW (ref 1.005–1.030)
pH: 7 (ref 5.0–8.0)

## 2017-10-15 LAB — BASIC METABOLIC PANEL
Anion gap: 10 (ref 5–15)
BUN: 6 mg/dL (ref 6–20)
CALCIUM: 8.9 mg/dL (ref 8.9–10.3)
CO2: 27 mmol/L (ref 22–32)
CREATININE: 1.04 mg/dL — AB (ref 0.44–1.00)
Chloride: 98 mmol/L (ref 98–111)
GFR calc Af Amer: >60 mL/min (ref >60)
Glucose, Bld: 100 mg/dL — ABNORMAL HIGH (ref 70–99)
Potassium: 3.9 mmol/L (ref 3.5–5.1)
Sodium: 135 mmol/L (ref 135–145)

## 2017-10-15 LAB — PREGNANCY, URINE: PREG TEST UR: NEGATIVE

## 2017-10-15 MED ORDER — HYDROCODONE-ACETAMINOPHEN 5-325 MG PO TABS: 1.0000 | ORAL_TABLET | Freq: Four times a day (QID) | ORAL | 0 refills | Status: DC | PRN

## 2017-10-15 MED ORDER — IOPAMIDOL (ISOVUE-300) INJECTION 61%
100.0000 mL | Freq: Once | INTRAVENOUS | Status: AC | PRN
  Administered 2017-10-15: 100 mL via INTRAVENOUS

## 2017-10-15 MED ORDER — FENTANYL CITRATE (PF) 100 MCG/2ML IJ SOLN
50.0000 ug | Freq: Once | INTRAMUSCULAR | Status: AC
  Administered 2017-10-15: 50 ug via INTRAVENOUS
  Filled 2017-10-15: qty 2

## 2017-10-15 MED ORDER — ONDANSETRON HCL 4 MG/2ML IJ SOLN
4.0000 mg | Freq: Once | INTRAMUSCULAR | Status: AC
  Administered 2017-10-15: 4 mg via INTRAVENOUS
  Filled 2017-10-15: qty 2

## 2017-10-15 MED ORDER — SODIUM CHLORIDE 0.9 % IV BOLUS
500.0000 mL | Freq: Once | INTRAVENOUS | Status: AC
  Administered 2017-10-15: 500 mL via INTRAVENOUS

## 2017-10-15 MED ORDER — ONDANSETRON 4 MG PO TBDP: 4.0000 mg | ORAL_TABLET | Freq: Three times a day (TID) | ORAL | 0 refills | Status: DC | PRN

## 2017-10-15 NOTE — ED Notes (Signed)
Pt taken to US

## 2017-10-15 NOTE — ED Triage Notes (Signed)
Pt c/o r lower abd pain radiating around to r lower back since last night.  Reports burning with urination, vomiting, and chills.  Denies history of kidney stones.  Denies any diarrhea, vaginal bleeding, or discharge.  LMP was 2 weeks ago and LBM was yesterday.

## 2017-10-15 NOTE — ED Notes (Signed)
Pt taken to ct 

## 2017-10-15 NOTE — ED Provider Notes (Signed)
[facility] EMERGENCY DEPARTMENT Provider Note   CSN: [id number] Arrival date & time: 10/15/17  0715     History   Chief Complaint Chief Complaint  Patient presents with  . Flank Pain    HPI Samantha Fox is a 37 y.o. female.  HPI Patient presents with right lower back pain that goes into her abdomen.  Began last night.  Also has nausea with some vomiting.  Also chills and urinary frequency.  Has not had pains like this before.  States she has had a little vaginal bleeding with wiping.  Last menses was 2 weeks ago and was normal.  No diarrhea.  Pain is dull.  Worse with certain movements.  Patient does not think she is pregnant. Past Medical History:  Diagnosis Date  . Acid reflux   . Anxiety   . High cholesterol   . Migraines   . Tendonitis     Patient Active Problem List   Diagnosis Date Noted  . Paresthesia 10/15/2012    Past Surgical History:  Procedure Laterality Date  . CHOLECYSTECTOMY    . TUBAL LIGATION       OB History   None      Home Medications    Prior to Admission medications   Medication Sig Start Date End Date Taking? Authorizing Provider  citalopram (CELEXA) 20 MG tablet Take 20 mg by mouth daily.   Yes [provider]  LINZESS 145 MCG CAPS capsule Take 145 mcg by mouth daily. 06/12/17  Yes [provider]  metoprolol tartrate (LOPRESSOR) 25 MG tablet Take 1 tablet (25 mg total) by mouth 2 (two) times daily. 07/22/17 10/20/17 Yes BranchDorothe Pea, MD  pantoprazole (PROTONIX) 40 MG tablet Take 40 mg by mouth daily. 06/12/17  Yes [provider]  TRI-LINYAH 0.18/0.215/0.25 MG-35 MCG tablet Take 1 tablet by mouth daily. 09/23/17  Yes [provider]  doxycycline (VIBRA-TABS) 100 MG tablet Take 100 mg by mouth at bedtime. with food 06/12/17   [provider]  HYDROcodone-acetaminophen (NORCO/VICODIN) 5-325 MG tablet Take 1-2 tablets by mouth every 6 (six) hours as needed. 10/15/17   Benjiman Core, MD    nitrofurantoin, macrocrystal-monohydrate, (MACROBID) 100 MG capsule Take 100 mg by mouth 2 (two) times daily. 10/14/17   [provider]  nortriptyline (PAMELOR) 10 MG capsule Take 40 mg by mouth at bedtime.    [provider]  ondansetron (ZOFRAN-ODT) 4 MG disintegrating tablet Take 1 tablet (4 mg total) by mouth every 8 (eight) hours as needed for nausea or vomiting. 10/15/17   Benjiman Core, MD  spironolactone (ALDACTONE) 100 MG tablet Take by mouth at bedtime. 04/23/17   [provider]    Family History No family history on file.  Social History Social History   Tobacco Use  . Smoking status: Never Smoker  . Smokeless tobacco: Never Used  Substance Use Topics  . Alcohol use: No  . Drug use: No     Allergies   Sulfa antibiotics and Amoxicillin-pot clavulanate   Review of Systems Review of Systems  Constitutional: Positive for appetite change and chills.  HENT: Negative for congestion.   Gastrointestinal: Positive for abdominal pain, nausea and vomiting.  Genitourinary: Positive for flank pain and vaginal bleeding.  Musculoskeletal: Positive for back pain.  Skin: Negative for rash.  Neurological: Negative for weakness.  Psychiatric/Behavioral: Negative for confusion.     Physical Exam Updated Vital Signs BP 107/73   Pulse 91   Temp 98.4 F (36.9 C) (  Oral)   Resp 17   Ht 5\' 3"  (1.6 m)   Wt 71.2 kg   LMP 10/01/2017   SpO2 98%   BMI 27.81 kg/m   Physical Exam  Constitutional: She appears well-developed.  HENT:  Head: Normocephalic.  Neck: Neck supple.  Cardiovascular: Normal rate.  Pulmonary/Chest: Effort normal.  Abdominal: There is tenderness.  Right lower quadrant tenderness without rebound or guarding.  Genitourinary:  Genitourinary Comments: Some CVA tenderness on right side.  Musculoskeletal: She exhibits no edema.  Neurological: She is alert.  Skin: Skin is warm.  Psychiatric: She has a normal mood and affect.      ED Treatments / Results  Labs (all labs ordered are listed, but only abnormal results are displayed) Labs Reviewed  URINALYSIS, ROUTINE W REFLEX MICROSCOPIC - Abnormal; Notable for the following components:      Result Value   APPearance CLOUDY (*)    Specific Gravity, Urine 1.002 (*)    Hgb urine dipstick SMALL (*)    Leukocytes, UA LARGE (*)    Bacteria, UA RARE (*)    All other components within normal limits  BASIC METABOLIC PANEL - Abnormal; Notable for the following components:   Glucose, Bld 100 (*)    Creatinine, Ser 1.04 (*)    All other components within normal limits  CBC WITH DIFFERENTIAL/PLATELET - Abnormal; Notable for the following components:   Platelets 422 (*)    All other components within normal limits  PREGNANCY, URINE    EKG None  Radiology US Transvaginal Non-ob  Result Date: 10/15/2017 CLINICAL DATA:  Lower abdominal pain for 1 day, burning with urination, abnormal CT exam with a cystic pelvic lesion EXAM: TRANSABDOMINAL AND TRANSVAGINAL ULTRASOUND OF PELVIS DOPPLER ULTRASOUND OF OVARIES TECHNIQUE: Both transabdominal and transvaginal ultrasound examinations of the pelvis were performed. Transabdominal technique was performed for global imaging of the pelvis including uterus, ovaries, adnexal regions, and pelvic cul-de-sac. It was necessary to proceed with endovaginal exam following the transabdominal exam to visualize the endometrium and ovaries. Color and duplex Doppler ultrasound was utilized to evaluate blood flow to the ovaries. COMPARISON:  CT abdomen and pelvis 10/15/2017 FINDINGS: Uterus Measurements: 9.1 x 3.9 x 6.3 cm.  Normal morphology without mass Endometrium Thickness: 3 mm thick.  Minimal endometrial fluid.  No focal mass. Right ovary Measurements: 2.3 x 1.1 x 1.2 cm. Normal morphology without mass. Internal blood flow present on color Doppler imaging. Left ovary Measurements: 5.7 x 2.1 x 3.9 cm. Simple appearing cyst identified 4.0 x 3.1 x  3.6 cm, appears to arise from the LEFT ovary and included in the above LEFT ovarian measurements. No internal echogenicity, septations or mural nodularity. Blood flow present within LEFT ovary on color Doppler imaging. Pulsed Doppler evaluation of both ovaries demonstrates normal low-resistance arterial and venous waveforms. Other findings Small amount of free pelvic fluid.  No additional adnexal masses. IMPRESSION: Minimal nonspecific endometrial fluid. Uterus and RIGHT ovary otherwise unremarkable. Simple appearing cyst arising from LEFT ovary, 4.0 x 3.1 x 3.6 cm. No evidence of ovarian torsion. Small amount of nonspecific free pelvic fluid. Electronically Signed   By: Ulyses Southward M.D.   On: 10/15/2017 12:31   US Pelvis Complete  Result Date: 10/15/2017 CLINICAL DATA:  Lower abdominal pain for 1 day, burning with urination, abnormal CT exam with a cystic pelvic lesion EXAM: TRANSABDOMINAL AND TRANSVAGINAL ULTRASOUND OF PELVIS DOPPLER ULTRASOUND OF OVARIES TECHNIQUE: Both transabdominal and transvaginal ultrasound examinations of the pelvis were performed. Transabdominal technique was  performed for global imaging of the pelvis including uterus, ovaries, adnexal regions, and pelvic cul-de-sac. It was necessary to proceed with endovaginal exam following the transabdominal exam to visualize the endometrium and ovaries. Color and duplex Doppler ultrasound was utilized to evaluate blood flow to the ovaries. COMPARISON:  CT abdomen and pelvis 10/15/2017 FINDINGS: Uterus Measurements: 9.1 x 3.9 x 6.3 cm.  Normal morphology without mass Endometrium Thickness: 3 mm thick.  Minimal endometrial fluid.  No focal mass. Right ovary Measurements: 2.3 x 1.1 x 1.2 cm. Normal morphology without mass. Internal blood flow present on color Doppler imaging. Left ovary Measurements: 5.7 x 2.1 x 3.9 cm. Simple appearing cyst identified 4.0 x 3.1 x 3.6 cm, appears to arise from the LEFT ovary and included in the above LEFT ovarian  measurements. No internal echogenicity, septations or mural nodularity. Blood flow present within LEFT ovary on color Doppler imaging. Pulsed Doppler evaluation of both ovaries demonstrates normal low-resistance arterial and venous waveforms. Other findings Small amount of free pelvic fluid.  No additional adnexal masses. IMPRESSION: Minimal nonspecific endometrial fluid. Uterus and RIGHT ovary otherwise unremarkable. Simple appearing cyst arising from LEFT ovary, 4.0 x 3.1 x 3.6 cm. No evidence of ovarian torsion. Small amount of nonspecific free pelvic fluid. Electronically Signed   By: Mark  Boles M.D.   On: 10/15/2017 12:31   Ct Abdomen Pelvis W Contrast  Result Date: 10/15/2017 CLINICAL DATA:  Lower abdominal pain, primarily right-sided. Dysuria. Vomiting. EXAM: CT ABDOMEN AND PELVIS WITH CONTRAST TECHNIQUE: Multidetector CT imaging of the abdomen and pelvis was performed using the standard protocol following bolus administration of intravenous contrast. CONTRAST:  TajikLawrence MarsDeMarland KitcheEncompass Health Rehabilitation Hospital [facility] S[address] 09TajikLawrence MarsDeMarland KitcheSanta Cruz Surgery Cen[address]01 [address] 47TajikLawrence MarsDeMarland KitcheWagoner Community Hospi68 Harri 25TajikLawrence MarsDeMarland KitcheNorth Ms State Hospi120 [address] 79TajikLawrence MarsDeMarland KitcheMichigan Outpatient Surgery Center 859 09TajikLawrence MarsDeMarland KitcheUcsd Center For Surgery Of Encinitas7695 16TajikLawrence MarsDeMarland KitchePuget Sound Gastroenterology7928 [address] 12TajikLawrence MarsDeMarland KitcheParsons State Hospi9233 [place]( 2)TajikLawrence MarsDeMarland KitcheCare 8385 H 58TajikLawrence MarsDeMarland KitcheSurgery Center Of [facility] 9895 [address] 26TajikLawrence MarsDeMarland KitcheSoldiers And Sailors Memorial Hospi7 06TajikLawrence MarsDeMarland KitcheLady Of The Sea General Hospi25 12TajikLawrence MarsDeMarland KitcheCatskill Regional [facility] Center [facility] Hospi29 West Sch l7TajikLawrence MarsDeMarland KitcheInova Loudoun Amb[facility] Surgery Center 60 08TajikLawrence MarsDeMarland KitcheHeritage Valley Bea7080 23TajikLawrence MarsDeMarland KitcheMetro Atlanta Endoscopy 9 O 03TajikLawrence MarsDeMarland KitcheHarris County Psychiatric Cen7 S. g5TajikLawrence MarsDeMarland KitcheRoseland Community Hospi7225 C 26TajikLawrence MarsDeMarland KitcheGrossmont Surgery Center66 28TajikLawrence MarsDeMarland KitcheW. G. ([facility] Hefner [facility] Cen842 [address] 70TajikLawrence MarsDeMarland KitcheSt Christophers [facility]65 [address] [place]( 3)TajikLawrence MarsDeMarland KitcheSedan City Hospi122 [address] [place] 84TajikLawrence MarsDeMarland KitcheCrescent View Surgery Center 150 Sou 54TajikLawrence MarsDeMarland KitcheSanta Maria Digestive Diagnostic Cen 17TajikLawrence MarsDeMarland KitcheAdvanced Endoscopy Center Of [facility] 8 09TajikLawrence MarsDeMarland KitcheSurgery Center At St [facility] Cen315 B( 1)TajikLawrence MarsDeMarland KitcheKindred Hospital - Albuquer76 [phone] Abbeee SaaL (ISOVUE-300) INJECTION 61% COMPARISON:  May 19, 2015 FINDINGS: Lower chest: Lung bases are clear. Hepatobiliary: No focal liver lesions are apparent. Gallbladder is absent. There is no appreciable biliary duct dilatation. Pancreas: There is no pancreatic mass or inflammatory focus. Spleen: No splenic lesions are evident. Adrenals/Urinary Tract: Adrenals bilaterally appear unremarkable. Kidneys bilaterally show no evident mass or hydronephrosis on either side. There is no appreciable renal or ureteral calculus on either side. Urinary bladder is midline with wall thickness within normal limits. Stomach/Bowel: There is no appreciable bowel wall or mesenteric thickening. No evident bowel obstruction. No free air or portal venous air. Vascular/Lymphatic: There is no abdominal aortic aneurysm. No vascular lesions are appreciable. There is no appreciable adenopathy in the abdomen or pelvis. Reproductive: Uterus is  retroverted. There is a cystic area in the mid left pelvis measuring 4.0 x 2.9 x 3.7 cm. No other pelvic masses evident. It is difficult to ascertain whether this cystic area arises eccentrically from the left adnexa versus arises in the mesentery apart from the adnexa. It appears somewhat more peripheral than is generally seen with an adnexal cystic area. No other evident pelvic mass. Tubal ligation clips are noted in the right pelvis, similar to prior study. Other: The appendix appears normal. There is no periappendiceal region inflammatory change. There is no abscess or ascites in the abdomen or pelvis. There is a small ventral hernia containing only fat. Musculoskeletal: There are no blastic or lytic bone lesions. No intramuscular lesions are evident. IMPRESSION: 1. There is a cystic area in the left pelvis measuring 4.0 x 2.9 x 3.7 cm. Suspect mesenteric cyst, as this cystic area appears more peripherally located than is generally appreciated with adnexal cystic mass. Correlation with pelvic ultrasound including Doppler assessment in this regard advised to further evaluate. 2. As noted previously, tubal ligation clips are  to the right of midline. Question left-sided clip displacement to the right side. 3. Appendix appears normal. No evident abscess in the abdomen or pelvis. No bowel obstruction. 4.  No evident renal or ureteral calculus.  No hydronephrosis. 5.  Gallbladder absent. 6.  Small ventral hernia containing only fat. Electronically Signed   By: Bretta Bang III M.D.   On: 10/15/2017 09:44   [place] Art/ven Flow Abd Pelv Doppler  Result Date: 10/15/2017 CLINICAL DATA:  Lower abdominal pain for 1 day, burning with urination, abnormal CT exam with a cystic pelvic lesion EXAM: TRANSABDOMINAL AND TRANSVAGINAL ULTRASOUND OF PELVIS DOPPLER ULTRASOUND OF OVARIES TECHNIQUE: Both transabdominal and transvaginal ultrasound examinations of the pelvis were performed. Transabdominal technique was performed for  global imaging of the pelvis including uterus, ovaries, adnexal regions, and pelvic cul-de-sac. It was necessary to proceed with endovaginal exam following the transabdominal exam to visualize the endometrium and ovaries. Color and duplex Doppler ultrasound was utilized to evaluate blood flow to the ovaries. COMPARISON:  CT abdomen and pelvis 10/15/2017 FINDINGS: Uterus Measurements: 9.1 x 3.9 x 6.3 cm.  Normal morphology without mass Endometrium Thickness: 3 mm thick.  Minimal endometrial fluid.  No focal mass. Right ovary Measurements: 2.3 x 1.1 x 1.2 cm. Normal morphology without mass. Internal blood flow present on color Doppler imaging. Left ovary Measurements: 5.7 x 2.1 x 3.9 cm. Simple appearing cyst identified 4.0 x 3.1 x 3.6 cm, appears to arise from the LEFT ovary and included in the above LEFT ovarian measurements. No internal echogenicity, septations or mural nodularity. Blood flow present within LEFT ovary on color Doppler imaging. Pulsed Doppler evaluation of both ovaries demonstrates normal low-resistance arterial and venous waveforms. Other findings Small amount of free pelvic fluid.  No additional adnexal masses. IMPRESSION: Minimal nonspecific endometrial fluid. Uterus and RIGHT ovary otherwise unremarkable. Simple appearing cyst arising from LEFT ovary, 4.0 x 3.1 x 3.6 cm. No evidence of ovarian torsion. Small amount of nonspecific free pelvic fluid. Electronically Signed   By: Ulyses Southward M.D.   On: 10/15/2017 12:31    Procedures Procedures (including critical care time)  Medications Ordered in ED Medications  sodium chloride 0.9 % bolus 500 mL (0 mLs Intravenous Stopped 10/15/17 0908)  ondansetron (ZOFRAN) injection 4 mg (4 mg Intravenous Given 10/15/17 0750)  fentaNYL (SUBLIMAZE) injection 50 mcg (50 mcg Intravenous Given 10/15/17 0750)  iopamidol (ISOVUE-300) 61 % injection 100 mL (100 mLs Intravenous Contrast Given 10/15/17 0906)  fentaNYL (SUBLIMAZE) injection 50 mcg (50 mcg  Intravenous Given 10/15/17 1107)  ondansetron (ZOFRAN) injection 4 mg (4 mg Intravenous Given 10/15/17 1107)     Initial Impression / Assessment and Plan / ED Course  I have reviewed the triage vital signs and the nursing notes.  Pertinent labs & imaging results that were available during my care of the patient were reviewed by me and considered in my medical decision making (see chart for details).     Patient with right-sided abdominal/flank pain.  Tenderness and has had nausea.  Pain improved after treatment.  Work-up overall reassuring.  CT scan showed left-sided pelvic mass.  Ultrasound done due to some question on CT scan.  It showed ovarian cyst.  Discharge home follow with PCP as needed.  Final Clinical Impressions(s) / ED Diagnoses   Final diagnoses:  Right flank pain    ED Discharge Orders         Ordered    ondansetron (ZOFRAN-ODT) 4 MG disintegrating tablet  Every 8 hours PRN  10/15/17 1314    HYDROcodone-acetaminophen (NORCO/VICODIN) 5-325 MG tablet  Every 6 hours PRN     10/15/17 1314           Benjiman Core, MD 10/15/17 1315

## 2017-10-15 NOTE — ED Notes (Signed)
Pt aware of [place]. edp aware of pt pain and nausea.

## 2017-10-28 DIAGNOSIS — R002 Palpitations: Secondary | ICD-10-CM | POA: Diagnosis not present

## 2017-10-28 DIAGNOSIS — R5383 Other fatigue: Secondary | ICD-10-CM | POA: Diagnosis not present

## 2017-10-28 DIAGNOSIS — E782 Mixed hyperlipidemia: Secondary | ICD-10-CM | POA: Diagnosis not present

## 2017-10-28 DIAGNOSIS — K219 Gastro-esophageal reflux disease without esophagitis: Secondary | ICD-10-CM | POA: Diagnosis not present

## 2017-11-21 DIAGNOSIS — Z6828 Body mass index (BMI) 28.0-28.9, adult: Secondary | ICD-10-CM | POA: Diagnosis not present

## 2017-11-21 DIAGNOSIS — J029 Acute pharyngitis, unspecified: Secondary | ICD-10-CM | POA: Diagnosis not present

## 2017-11-21 DIAGNOSIS — R509 Fever, unspecified: Secondary | ICD-10-CM | POA: Diagnosis not present

## 2017-12-22 DIAGNOSIS — J019 Acute sinusitis, unspecified: Secondary | ICD-10-CM | POA: Diagnosis not present

## 2017-12-22 DIAGNOSIS — Z6827 Body mass index (BMI) 27.0-27.9, adult: Secondary | ICD-10-CM | POA: Diagnosis not present

## 2017-12-22 DIAGNOSIS — J029 Acute pharyngitis, unspecified: Secondary | ICD-10-CM | POA: Diagnosis not present

## 2018-01-02 ENCOUNTER — Other Ambulatory Visit: Payer: Self-pay | Admitting: Cardiology

## 2018-01-16 ENCOUNTER — Encounter: Payer: Self-pay | Admitting: Cardiology

## 2018-01-16 ENCOUNTER — Ambulatory Visit: Payer: BLUE CROSS/BLUE SHIELD | Admitting: Cardiology

## 2018-01-16 VITALS — BP 118/84 | HR 76 | Ht 63.0 in | Wt 158.0 lb

## 2018-01-16 DIAGNOSIS — I493 Ventricular premature depolarization: Secondary | ICD-10-CM | POA: Diagnosis not present

## 2018-01-16 DIAGNOSIS — R002 Palpitations: Secondary | ICD-10-CM | POA: Diagnosis not present

## 2018-01-16 MED ORDER — METOPROLOL TARTRATE 25 MG PO TABS: 37.5000 mg | ORAL_TABLET | Freq: Two times a day (BID) | ORAL | 3 refills | Status: DC

## 2018-01-16 NOTE — Patient Instructions (Addendum)
Medication Instructions:   Increase Lopressor to 37.5mg  twice a day.  Continue all other medications.    Labwork: none  Testing/Procedures: none  Follow-Up: Your physician wants you to follow up in: 6 months.  You will receive a reminder letter in the mail one-two months in advance.  If you don't receive a letter, please call our office to schedule the follow up appointment   Any Other Special Instructions Will Be Listed Below (If Applicable).  If you need a refill on your cardiac medications before your next appointment, please call your pharmacy.

## 2018-01-16 NOTE — Progress Notes (Signed)
Clinical Summary [name] is a 37 y.o.female  seen today for follow up of the following medical problems.   1. Palpitations -off and on for several years. Feeling of heart pounding, can feel lightheaded. Can have some SOB - increased frequency over the last month.  - can occur rest or with activity. Can last up to 5 minutes. - no coffee, no sodas, occasional tea, no energy drinks, no EtOH. - pcp shows SR with PVCs - K 4.8 - rides bike two times a week x 30min to 1 hr, tolerates well  - 06/2017 holter with only occasional PVCs, no significant arrhythmias - still with episodes at times since last visit, overall unchanged. .    - last visit we started lopressor 25mg  bid. Some increase in symptoms, +SOB. Episodes last up 10-15 minutes. No specific trigger.     On aldactone for acne per report.  Past Medical History:  Diagnosis Date  . Acid reflux   . Anxiety   . High cholesterol   . Migraines   . Tendonitis      Allergies  Allergen Reactions  . Sulfa Antibiotics Hives  . Amoxicillin-Pot Clavulanate Nausea Only     Current Outpatient Medications  Medication Sig Dispense Refill  . citalopram (CELEXA) 20 MG tablet Take 20 mg by mouth daily.    Marland Kitchen. doxycycline (VIBRA-TABS) 100 MG tablet Take 100 mg by mouth at bedtime. with food  5  . HYDROcodone-acetaminophen (NORCO/VICODIN) 5-325 MG tablet Take 1-2 tablets by mouth every 6 (six) hours as needed. 6 tablet 0  . LINZESS 145 MCG CAPS capsule Take 145 mcg by mouth daily.  1  . metoprolol tartrate (LOPRESSOR) 25 MG tablet TAKE 1 TABLET BY MOUTH TWICE DAILY 180 tablet 1  . nitrofurantoin, macrocrystal-monohydrate, (MACROBID) 100 MG capsule Take 100 mg by mouth 2 (two) times daily.  0  . nortriptyline (PAMELOR) 10 MG capsule Take 40 mg by mouth at bedtime.    . ondansetron (ZOFRAN-ODT) 4 MG disintegrating tablet Take 1 tablet (4 mg total) by mouth every 8 (eight) hours as needed for nausea or vomiting. 8 tablet 0  .  pantoprazole (PROTONIX) 40 MG tablet Take 40 mg by mouth daily.  0  . spironolactone (ALDACTONE) 100 MG tablet Take by mouth at bedtime.    Nuala Alpha. TRI-LINYAH 0.18/0.215/0.25 MG-35 MCG tablet Take 1 tablet by mouth daily.  5   No current facility-administered medications for this visit.      Past Surgical History:  Procedure Laterality Date  . CHOLECYSTECTOMY    . TUBAL LIGATION       Allergies  Allergen Reactions  . Sulfa Antibiotics Hives  . Amoxicillin-Pot Clavulanate Nausea Only      No family history on file.   Social History [name] reports that she has never smoked. She has never used smokeless tobacco. [name] reports no history of alcohol use.   Review of Systems CONSTITUTIONAL: No weight loss, fever, chills, weakness or fatigue.  HEENT: Eyes: No visual loss, blurred vision, double vision or yellow sclerae.No hearing loss, sneezing, congestion, runny nose or sore throat.  SKIN: No rash or itching.  CARDIOVASCULAR: per hpi RESPIRATORY: No shortness of breath, cough or sputum.  GASTROINTESTINAL: No anorexia, nausea, vomiting or diarrhea. No abdominal pain or blood.  GENITOURINARY: No burning on urination, no polyuria NEUROLOGICAL: No headache, dizziness, syncope, paralysis, ataxia, numbness or tingling in the extremities. No change in bowel or bladder control.  MUSCULOSKELETAL: No muscle, back pain,  joint pain or stiffness.  LYMPHATICS: No enlarged nodes. No history of splenectomy.  PSYCHIATRIC: No history of depression or anxiety.  ENDOCRINOLOGIC: No reports of sweating, cold or heat intolerance. No polyuria or polydipsia.  Marland Kitchen.   Physical Examination Vitals:   01/16/18 0924  BP: 118/84  Pulse: 76  SpO2: 99%   Vitals:   01/16/18 0924  Weight: 158 lb (71.7 kg)  Height: 5\' 3"  (1.6 m)    Gen: resting comfortably, no acute distress HEENT: no scleral icterus, pupils equal round and reactive, no palptable cervical adenopathy,  CV: RRR, no m/r/g, no  jvd Resp: Clear to auscultation bilaterally GI: abdomen is soft, non-tender, non-distended, normal bowel sounds, no hepatosplenomegaly MSK: extremities are warm, no edema.  Skin: warm, no rash Neuro:  no focal deficits Psych: appropriate affect   Diagnostic Studies 06/2017 2r h holter  24 hr holter monitor  Min HR 59, Max HR 159, Avg HR 93  No supreventricular ectopy  Occasisional ventricular ectopy, 6% burden. Primarily in the the form of singlets, couplts, bigeminy.  No diary submitted  No significant arrhythmias    Assessment and Plan   1. Palpitations - holter shows some PVCs, no significant arrhythmias. - we will increase lopressor to 37.5mg  bid and follow symptoms.      Antoine PocheJonathan F. Pleshette Tomasini, M.D

## 2018-02-02 DIAGNOSIS — M7061 Trochanteric bursitis, right hip: Secondary | ICD-10-CM | POA: Diagnosis not present

## 2018-02-02 DIAGNOSIS — M545 Low back pain: Secondary | ICD-10-CM | POA: Diagnosis not present

## 2018-02-02 DIAGNOSIS — Z6828 Body mass index (BMI) 28.0-28.9, adult: Secondary | ICD-10-CM | POA: Diagnosis not present

## 2018-02-02 DIAGNOSIS — M62838 Other muscle spasm: Secondary | ICD-10-CM | POA: Diagnosis not present

## 2018-02-11 DIAGNOSIS — B029 Zoster without complications: Secondary | ICD-10-CM | POA: Diagnosis not present

## 2018-02-11 DIAGNOSIS — Z6828 Body mass index (BMI) 28.0-28.9, adult: Secondary | ICD-10-CM | POA: Diagnosis not present

## 2018-02-17 DIAGNOSIS — J111 Influenza due to unidentified influenza virus with other respiratory manifestations: Secondary | ICD-10-CM | POA: Diagnosis not present

## 2018-02-17 DIAGNOSIS — Z6829 Body mass index (BMI) 29.0-29.9, adult: Secondary | ICD-10-CM | POA: Diagnosis not present

## 2018-03-02 DIAGNOSIS — L700 Acne vulgaris: Secondary | ICD-10-CM | POA: Diagnosis not present

## 2018-03-05 DIAGNOSIS — Z6828 Body mass index (BMI) 28.0-28.9, adult: Secondary | ICD-10-CM | POA: Diagnosis not present

## 2018-03-05 DIAGNOSIS — L259 Unspecified contact dermatitis, unspecified cause: Secondary | ICD-10-CM | POA: Diagnosis not present

## 2018-07-09 ENCOUNTER — Telehealth: Payer: Self-pay | Admitting: Cardiology

## 2018-07-09 NOTE — Telephone Encounter (Signed)
Virtual Visit Pre-Appointment Phone Call  "(Name), I am calling you today to discuss your upcoming appointment. We are currently trying to limit exposure to the virus that causes COVID-19 by seeing patients at home rather than in the office."  1. "What is the BEST phone number to call the day of the visit?" - include this in appointment notes  2. Do you have or have access to (through a family member/friend) a smartphone with video capability that we can use for your visit?" a. If yes - list this number in appt notes as cell (if different from BEST phone #) and list the appointment type as a VIDEO visit in appointment notes b. If no - list the appointment type as a PHONE visit in appointment notes  3. Confirm consent - "In the setting of the current Covid19 crisis, you are scheduled for a (phone or video) visit with your provider on (date) at (time).  Just as we do with many in-office visits, in order for you to participate in this visit, we must obtain consent.  If you'd like, I can send this to your mychart (if signed up) or email for you to review.  Otherwise, I can obtain your verbal consent now.  All virtual visits are billed to your insurance company just like a normal visit would be.  By agreeing to a virtual visit, we'd like you to understand that the technology does not allow for your provider to perform an examination, and thus may limit your provider's ability to fully assess your condition. If your provider identifies any concerns that need to be evaluated in person, we will make arrangements to do so.  Finally, though the technology is pretty good, we cannot assure that it will always work on either your or our end, and in the setting of a video visit, we may have to convert it to a phone-only visit.  In either situation, we cannot ensure that we have a secure connection.  Are you willing to proceed?" STAFF: Did the patient verbally acknowledge consent to telehealth visit? Document  YES/NO here: yes  4. Advise patient to be prepared - "Two hours prior to your appointment, go ahead and check your blood pressure, pulse, oxygen saturation, and your weight (if you have the equipment to check those) and write them all down. When your visit starts, your provider will ask you for this information. If you have an Apple Watch or Kardia device, please plan to have heart rate information ready on the day of your appointment. Please have a pen and paper handy nearby the day of the visit as well."  5. Give patient instructions for MyChart download to smartphone OR Doximity/Doxy.me as below if video visit (depending on what platform provider is using)  6. Inform patient they will receive a phone call 15 minutes prior to their appointment time (may be from unknown caller ID) so they should be prepared to answer    TELEPHONE CALL NOTE  Samantha Crowmanda B Langenderfer has been deemed a candidate for a follow-up tele-health visit to limit community exposure during the Covid-19 pandemic. I spoke with the patient via phone to ensure availability of phone/video source, confirm preferred email & phone number, and discuss instructions and expectations.  I reminded Samantha Fox to be prepared with any vital sign and/or heart rhythm information that could potentially be obtained via home monitoring, at the time of her visit. I reminded Samantha Fox to expect a phone call prior to  her visit.  Samantha Fox 07/09/2018 9:03 AM   INSTRUCTIONS FOR DOWNLOADING THE MYCHART APP TO SMARTPHONE  - The patient must first make sure to have activated MyChart and know their login information - If Apple, go to [employer] and type in MyChart in the search bar and download the app. If Android, ask patient to go to [employer] and type in [place] in the search bar and download the app. The app is free but as with any other app downloads, their phone may require them to verify saved payment information or  Apple/Android password.  - The patient will need to then log into the app with their MyChart username and password, and select [facility] as their healthcare provider to link the account. When it is time for your visit, go to the MyChart app, find appointments, and click Begin Video Visit. Be sure to Select Allow for your device to access the Microphone and Camera for your visit. You will then be connected, and your provider will be with you shortly.  **If they have any issues connecting, or need assistance please contact MyChart service desk [phone])**  **If using a computer, in order to ensure the best quality for their visit they will need to use either of the following Internet Browsers: [employer], or Google Chrome**  IF USING DOXIMITY or DOXY.ME - The patient will receive a link just prior to their visit by text.     FULL LENGTH CONSENT FOR TELE-HEALTH VISIT   I hereby voluntarily request, consent and authorize [facility] and its employed or contracted physicians, physician assistants, nurse practitioners or other licensed health care professionals (the Practitioner), to provide me with telemedicine health care services (the Services") as deemed necessary by the treating Practitioner. I acknowledge and consent to receive the Services by the Practitioner via telemedicine. I understand that the telemedicine visit will involve communicating with the Practitioner through live audiovisual communication technology and the disclosure of certain medical information by electronic transmission. I acknowledge that I have been given the opportunity to request an in-person assessment or other available alternative prior to the telemedicine visit and am voluntarily participating in the telemedicine visit.  I understand that I have the right to withhold or withdraw my consent to the use of telemedicine in the course of my care at any time, without affecting my right to future care  or treatment, and that the Practitioner or I may terminate the telemedicine visit at any time. I understand that I have the right to inspect all information obtained and/or recorded in the course of the telemedicine visit and may receive copies of available information for a reasonable fee.  I understand that some of the potential risks of receiving the Services via telemedicine include:   Delay or interruption in medical evaluation due to technological equipment failure or disruption;  Information transmitted may not be sufficient (e.g. poor resolution of images) to allow for appropriate medical decision making by the Practitioner; and/or   In rare instances, security protocols could fail, causing a breach of personal health information.  Furthermore, I acknowledge that it is my responsibility to provide information about my medical history, conditions and care that is complete and accurate to the best of my ability. I acknowledge that Practitioner's advice, recommendations, and/or decision may be based on factors not within their control, such as incomplete or inaccurate data provided by me or distortions of diagnostic images or specimens that may result from electronic transmissions. I  understand that the practice of medicine is not an exact science and that Practitioner makes no warranties or guarantees regarding treatment outcomes. I acknowledge that I will receive a copy of this consent concurrently upon execution via email to the email address I last provided but may also request a printed copy by calling the office of [facility].    I understand that my insurance will be billed for this visit.   I have read or had this consent read to me.  I understand the contents of this consent, which adequately explains the benefits and risks of the Services being provided via telemedicine.   I have been provided ample opportunity to ask questions regarding this consent and the Services and have had  my questions answered to my satisfaction.  I give my informed consent for the services to be provided through the use of telemedicine in my medical care  By participating in this telemedicine visit I agree to the above.

## 2018-07-17 ENCOUNTER — Telehealth (INDEPENDENT_AMBULATORY_CARE_PROVIDER_SITE_OTHER): Payer: BC Managed Care – PPO | Admitting: Cardiology

## 2018-07-17 ENCOUNTER — Encounter: Payer: Self-pay | Admitting: Cardiology

## 2018-07-17 VITALS — BP 109/93 | HR 96 | Ht 63.0 in | Wt 160.0 lb

## 2018-07-17 DIAGNOSIS — R002 Palpitations: Secondary | ICD-10-CM | POA: Diagnosis not present

## 2018-07-17 DIAGNOSIS — I493 Ventricular premature depolarization: Secondary | ICD-10-CM

## 2018-07-17 NOTE — Progress Notes (Signed)
Virtual Visit via Video Note   This visit type was conducted due to national recommendations for restrictions regarding the COVID-19 Pandemic (e.g. social distancing) in an effort to limit this patient's exposure and mitigate transmission in our community.  Due to her co-morbid illnesses, this patient is at least at moderate risk for complications without adequate follow up.  This format is felt to be most appropriate for this patient at this time.  All issues noted in this document were discussed and addressed.  A limited physical exam was performed with this format.  Please refer to the patient's chart for her consent to telehealth for [facility].   Date:  07/17/2018   ID:  Samantha Fox, DOB April 22, 1980, MRN [id number]  Patient Location: Home Provider Location: Home  PCP:  Selinda FlavinHoward, Kevin, MD  Cardiologist:  Dina RichBranch, Jonathan, MD  Electrophysiologist:  None   Evaluation Performed:  Follow-Up Visit  Chief Complaint:  Palpitations  History of Present Illness:    Samantha Fox is a 38 y.o. female seen today for follow up of the following medical problems.  1. Palpitations -off and on for several years. Feeling of heart pounding, can feel lightheaded. Can have some SOB - increased frequency over the last month.  - can occur rest or with activity. Can last up to 5 minutes. - no coffee, no sodas, occasional tea, no energy drinks, no EtOH. - pcp shows SR with PVCs - K 4.8 - rides bike two times a week x 30min to 1 hr, tolerates well  - 06/2017 holter with only occasional PVCs, no significant arrhythmias  - last visit we increased lopressor to 37.5mg  bid. Denies any significant symptoms recently.   The patient does not have symptoms concerning for COVID-19 infection (fever, chills, cough, or new shortness of breath).    Past Medical History:  Diagnosis Date  . Acid reflux   . Anxiety   . High cholesterol   . Migraines   . Tendonitis    Past Surgical History:   Procedure Laterality Date  . CHOLECYSTECTOMY    . TUBAL LIGATION       No outpatient medications have been marked as taking for the 07/17/18 encounter (Appointment) with Antoine PocheBranch, Jonathan F, MD.     Allergies:   Sulfa antibiotics and Amoxicillin-pot clavulanate   Social History   Tobacco Use  . Smoking status: Never Smoker  . Smokeless tobacco: Never Used  Substance Use Topics  . Alcohol use: No  . Drug use: No     Family Hx: The patient's family history is not on file.  ROS:   Please see the history of present illness.     All other systems reviewed and are negative.   Prior CV studies:   The following studies were reviewed today:  06/2017 2r h holter  24 hr holter monitor  Min HR 59, Max HR 159, Avg HR 93  No supreventricular ectopy  Occasisional ventricular ectopy, 6% burden. Primarily in the the form of singlets, couplts, bigeminy.  No diary submitted  No significant arrhythmias  Labs/Other Tests and Data Reviewed:    EKG:  No ECG reviewed.  Recent Labs: 10/15/2017: BUN 6; Creatinine, Ser 1.04; Hemoglobin 13.5; Platelets 422; Potassium 3.9; Sodium 135   Recent Lipid Panel No results found for: CHOL, TRIG, HDL, CHOLHDL, LDLCALC, LDLDIRECT  Wt Readings from Last 3 Encounters:  01/16/18 158 lb (71.7 kg)  10/15/17 157 lb (71.2 kg)  07/22/17 157 lb (71.2 kg)  Objective:    Vital Signs:   Today's Vitals   07/17/18 1443  BP: (!) 109/93  Pulse: 96  Weight: 160 lb (72.6 kg)  Height: 5\' 3"  (1.6 m)   Body mass index is 28.34 kg/m.  Well nourished female sitting comfortably in no distress. Normal speech pattern and tone. No visual or audible signs of SOB or wheezing.   ASSESSMENT & PLAN:    1. Palpitations -holter shows some PVCs, no significant arrhythmias. -doing well on lopressor 37.5mg  bid, continue current meds    COVID-19 Education: The signs and symptoms of COVID-19 were discussed with the patient and how to seek care for testing  (follow up with PCP or arrange E-visit).  The importance of social distancing was discussed today.  Time:   Today, I have spent 10 minutes with the patient with telehealth technology discussing the above problems.     Medication Adjustments/Labs and Tests Ordered: Current medicines are reviewed at length with the patient today.  Concerns regarding medicines are outlined above.   Tests Ordered: No orders of the defined types were placed in this encounter.   Medication Changes: No orders of the defined types were placed in this encounter.   Follow Up:  In Person in 1 year(s)  Signed, Carlyle Dolly, MD  07/17/2018 1:25 PM    [facility]

## 2018-07-17 NOTE — Patient Instructions (Signed)

## 2018-08-10 DIAGNOSIS — Z20828 Contact with and (suspected) exposure to other viral communicable diseases: Secondary | ICD-10-CM | POA: Diagnosis not present

## 2018-08-11 ENCOUNTER — Other Ambulatory Visit: Payer: BC Managed Care – PPO

## 2018-08-11 ENCOUNTER — Other Ambulatory Visit: Payer: Self-pay

## 2018-08-11 DIAGNOSIS — Z20822 Contact with and (suspected) exposure to covid-19: Secondary | ICD-10-CM

## 2018-08-11 DIAGNOSIS — R6889 Other general symptoms and signs: Secondary | ICD-10-CM | POA: Diagnosis not present

## 2018-08-13 LAB — NOVEL CORONAVIRUS, NAA: SARS-CoV-2, NAA: NOT DETECTED

## 2018-08-24 DIAGNOSIS — M9902 Segmental and somatic dysfunction of thoracic region: Secondary | ICD-10-CM | POA: Diagnosis not present

## 2018-08-24 DIAGNOSIS — M9903 Segmental and somatic dysfunction of lumbar region: Secondary | ICD-10-CM | POA: Diagnosis not present

## 2018-08-24 DIAGNOSIS — M545 Low back pain: Secondary | ICD-10-CM | POA: Diagnosis not present

## 2018-08-24 DIAGNOSIS — M546 Pain in thoracic spine: Secondary | ICD-10-CM | POA: Diagnosis not present

## 2018-08-28 DIAGNOSIS — M9902 Segmental and somatic dysfunction of thoracic region: Secondary | ICD-10-CM | POA: Diagnosis not present

## 2018-08-28 DIAGNOSIS — M9903 Segmental and somatic dysfunction of lumbar region: Secondary | ICD-10-CM | POA: Diagnosis not present

## 2018-08-28 DIAGNOSIS — M546 Pain in thoracic spine: Secondary | ICD-10-CM | POA: Diagnosis not present

## 2018-08-28 DIAGNOSIS — M545 Low back pain: Secondary | ICD-10-CM | POA: Diagnosis not present

## 2018-08-31 DIAGNOSIS — M545 Low back pain: Secondary | ICD-10-CM | POA: Diagnosis not present

## 2018-08-31 DIAGNOSIS — M9903 Segmental and somatic dysfunction of lumbar region: Secondary | ICD-10-CM | POA: Diagnosis not present

## 2018-08-31 DIAGNOSIS — M9902 Segmental and somatic dysfunction of thoracic region: Secondary | ICD-10-CM | POA: Diagnosis not present

## 2018-08-31 DIAGNOSIS — M546 Pain in thoracic spine: Secondary | ICD-10-CM | POA: Diagnosis not present

## 2018-09-11 DIAGNOSIS — Z6828 Body mass index (BMI) 28.0-28.9, adult: Secondary | ICD-10-CM | POA: Diagnosis not present

## 2018-09-11 DIAGNOSIS — F419 Anxiety disorder, unspecified: Secondary | ICD-10-CM | POA: Diagnosis not present

## 2018-09-11 DIAGNOSIS — L304 Erythema intertrigo: Secondary | ICD-10-CM | POA: Diagnosis not present

## 2018-09-21 DIAGNOSIS — L304 Erythema intertrigo: Secondary | ICD-10-CM | POA: Diagnosis not present

## 2018-09-29 DIAGNOSIS — R7989 Other specified abnormal findings of blood chemistry: Secondary | ICD-10-CM | POA: Diagnosis not present

## 2018-11-11 DIAGNOSIS — J029 Acute pharyngitis, unspecified: Secondary | ICD-10-CM | POA: Diagnosis not present

## 2018-11-17 DIAGNOSIS — L709 Acne, unspecified: Secondary | ICD-10-CM | POA: Diagnosis not present

## 2018-12-19 DIAGNOSIS — J029 Acute pharyngitis, unspecified: Secondary | ICD-10-CM | POA: Diagnosis not present

## 2018-12-19 DIAGNOSIS — R509 Fever, unspecified: Secondary | ICD-10-CM | POA: Diagnosis not present

## 2018-12-19 DIAGNOSIS — H6501 Acute serous otitis media, right ear: Secondary | ICD-10-CM | POA: Diagnosis not present

## 2018-12-23 DIAGNOSIS — B349 Viral infection, unspecified: Secondary | ICD-10-CM | POA: Diagnosis not present

## 2018-12-23 DIAGNOSIS — L209 Atopic dermatitis, unspecified: Secondary | ICD-10-CM | POA: Diagnosis not present

## 2019-01-07 DIAGNOSIS — L219 Seborrheic dermatitis, unspecified: Secondary | ICD-10-CM | POA: Diagnosis not present

## 2019-01-07 DIAGNOSIS — B352 Tinea manuum: Secondary | ICD-10-CM | POA: Diagnosis not present

## 2019-01-07 DIAGNOSIS — L409 Psoriasis, unspecified: Secondary | ICD-10-CM | POA: Diagnosis not present

## 2019-01-31 ENCOUNTER — Other Ambulatory Visit: Payer: Self-pay | Admitting: Cardiology

## 2019-02-07 ENCOUNTER — Encounter (HOSPITAL_COMMUNITY): Payer: Self-pay | Admitting: *Deleted

## 2019-02-07 ENCOUNTER — Emergency Department (HOSPITAL_COMMUNITY)
Admission: EM | Admit: 2019-02-07 | Discharge: 2019-02-07 | Disposition: A | Discharge status: Home / Self Care | Payer: 59 | Attending: Emergency Medicine | Admitting: Emergency Medicine

## 2019-02-07 ENCOUNTER — Emergency Department (HOSPITAL_COMMUNITY): Payer: 59

## 2019-02-07 ENCOUNTER — Other Ambulatory Visit: Payer: Self-pay

## 2019-02-07 DIAGNOSIS — R509 Fever, unspecified: Secondary | ICD-10-CM | POA: Diagnosis present

## 2019-02-07 DIAGNOSIS — Z79899 Other long term (current) drug therapy: Secondary | ICD-10-CM | POA: Insufficient documentation

## 2019-02-07 DIAGNOSIS — R0602 Shortness of breath: Secondary | ICD-10-CM

## 2019-02-07 DIAGNOSIS — J189 Pneumonia, unspecified organism: Secondary | ICD-10-CM | POA: Insufficient documentation

## 2019-02-07 DIAGNOSIS — J1282 Pneumonia due to coronavirus disease 2019: Secondary | ICD-10-CM

## 2019-02-07 LAB — TROPONIN I (HIGH SENSITIVITY): Troponin I (High Sensitivity): <2 ng/L

## 2019-02-07 LAB — CBC WITH DIFFERENTIAL/PLATELET
Abs Immature Granulocytes: 0.06 K/uL (ref 0.00–0.07)
Basophils Absolute: 0.1 K/uL (ref 0.0–0.1)
Basophils Relative: 0 %
Eosinophils Absolute: 0.2 K/uL (ref 0.0–0.5)
Eosinophils Relative: 1 %
HCT: 35.2 % — ABNORMAL LOW (ref 36.0–46.0)
Hemoglobin: 11.3 g/dL — ABNORMAL LOW (ref 12.0–15.0)
Immature Granulocytes: 0 %
Lymphocytes Relative: 43 %
Lymphs Abs: 7 K/uL — ABNORMAL HIGH (ref 0.7–4.0)
MCH: 29.6 pg (ref 26.0–34.0)
MCHC: 32.1 g/dL (ref 30.0–36.0)
MCV: 92.1 fL (ref 80.0–100.0)
Monocytes Absolute: 1.4 K/uL — ABNORMAL HIGH (ref 0.1–1.0)
Monocytes Relative: 8 %
Neutro Abs: 7.8 K/uL — ABNORMAL HIGH (ref 1.7–7.7)
Neutrophils Relative %: 48 %
Platelets: 515 K/uL — ABNORMAL HIGH (ref 150–400)
RBC: 3.82 MIL/uL — ABNORMAL LOW (ref 3.87–5.11)
RDW: 13.2 % (ref 11.5–15.5)
WBC: 16.7 K/uL — ABNORMAL HIGH (ref 4.0–10.5)
nRBC: 0 % (ref 0.0–0.2)

## 2019-02-07 LAB — BASIC METABOLIC PANEL WITH GFR
Anion gap: 5 (ref 5–15)
BUN: 12 mg/dL (ref 6–20)
CO2: 26 mmol/L (ref 22–32)
Calcium: 8 mg/dL — ABNORMAL LOW (ref 8.9–10.3)
Chloride: 107 mmol/L (ref 98–111)
Creatinine, Ser: 0.92 mg/dL (ref 0.44–1.00)
GFR calc Af Amer: >60 mL/min
GFR calc non Af Amer: >60 mL/min
Glucose, Bld: 99 mg/dL (ref 70–99)
Potassium: 3.4 mmol/L — ABNORMAL LOW (ref 3.5–5.1)
Sodium: 138 mmol/L (ref 135–145)

## 2019-02-07 LAB — URINALYSIS, ROUTINE W REFLEX MICROSCOPIC
Bilirubin Urine: NEGATIVE
Glucose, UA: NEGATIVE mg/dL
Hgb urine dipstick: NEGATIVE
Ketones, ur: NEGATIVE mg/dL
Leukocytes,Ua: NEGATIVE
Nitrite: NEGATIVE
Protein, ur: NEGATIVE mg/dL
Specific Gravity, Urine: 1.01 (ref 1.005–1.030)
pH: 6 (ref 5.0–8.0)

## 2019-02-07 LAB — I-STAT BETA HCG BLOOD, ED (MC, WL, AP ONLY): I-stat hCG, quantitative: <5 m[IU]/mL

## 2019-02-07 LAB — D-DIMER, QUANTITATIVE: D-Dimer, Quant: 0.35 ug{FEU}/mL (ref 0.00–0.50)

## 2019-02-07 MED ORDER — DEXAMETHASONE SODIUM PHOSPHATE 4 MG/ML IJ SOLN
4.0000 mg | Freq: Once | INTRAMUSCULAR | Status: AC
  Administered 2019-02-07: 21:00:00 4 mg via INTRAVENOUS
  Filled 2019-02-07: qty 1

## 2019-02-07 MED ORDER — ALBUTEROL SULFATE HFA 108 (90 BASE) MCG/ACT IN AERS
1.0000 | INHALATION_SPRAY | Freq: Once | RESPIRATORY_TRACT | Status: AC
  Administered 2019-02-07: 21:00:00 2 via RESPIRATORY_TRACT
  Filled 2019-02-07: qty 6.7

## 2019-02-07 MED ORDER — SODIUM CHLORIDE 0.9 % IV BOLUS
1000.0000 mL | Freq: Once | INTRAVENOUS | Status: AC
  Administered 2019-02-07: 20:00:00 1000 mL via INTRAVENOUS

## 2019-02-07 NOTE — ED Triage Notes (Signed)
Pt states that she was diagnosed with covid on January 18 2019, continues to have chest pain, congestion, cough that is slightly productive, unsure of color of sputum, sob.

## 2019-02-07 NOTE — Discharge Instructions (Signed)
As we discussed, your work-up today was reassuring.  Your chest x-ray did show a pneumonia that is likely viral in nature and related to your ongoing COVID-19 infection.  As we discussed, being a viral pneumonia, does not respond to antibiotics.  Use albuterol inhaler every 4-6 hours, 1 to 2 puffs for coughing.  Continue take Tylenol and ibuprofen for fever.  Make sure you are staying hydrated and drink plenty of fluids.  Return the emergency department for any worsening chest pain, difficulty breathing, inability eat or drink, vomiting or any other worsening or concerning symptoms.

## 2019-02-07 NOTE — ED Notes (Signed)
Pt oxygen level stayed at 98% while ambulating. NAD.

## 2019-02-07 NOTE — ED Provider Notes (Signed)
[facility] Provider Note   CSN: [id number] Arrival date & time: 02/07/19  1825     History Chief Complaint  Patient presents with  . Fever    Samantha Fox is a 39 y.o. female past medical history of migraines, high cholesterol who presents for evaluation of chest pain, cough, shortness of breath, fever.  She reports that she tested positive for COVID-19 on 01/18/2019 after initially having symptoms that started on 01/14/2019.  She states that she had been feeling better to the last for 5 days when she started having a lot more congestion, cough, shortness of breath, chest pain.  Cough is productive of phlegm.  No hemoptysis.  Reports that chest pressure feels like an achiness and a pressure over the anterior aspect of her chest.  She reports it is worse with coughing and also worse with deep inspiration.  She feels short of breath both at rest and with exertion.  She states she has felt fatigued and rundown.  Additionally, she started having a fever of 102.1 over the last several days.  She has been taking Tylenol and ibuprofen with her last dose being earlier today prior to ED visit.  Patient states that she does not smoke.  She has no history of asthma or COPD.  She does report she is currently on birth control pills.  She denies any leg swelling, recent travel, recent surgeries or hospitalizations, history of blood clots in her legs or lungs.  The history is provided by the patient.       Past Medical History:  Diagnosis Date  . Acid reflux   . Anxiety   . High cholesterol   . Migraines   . Tendonitis     Patient Active Problem List   Diagnosis Date Noted  . Paresthesia 10/15/2012    Past Surgical History:  Procedure Laterality Date  . CHOLECYSTECTOMY    . TUBAL LIGATION       OB History   No obstetric history on file.     No family history on file.  Social History   Tobacco Use  . Smoking status: Never Smoker  . Smokeless tobacco:  Never Used  Substance Use Topics  . Alcohol use: No  . Drug use: No    Home Medications Prior to Admission medications   Medication Sig Start Date End Date Taking? Authorizing Provider  citalopram (CELEXA) 20 MG tablet Take 20 mg by mouth daily.    [provider]  doxycycline (VIBRA-TABS) 100 MG tablet Take 100 mg by mouth at bedtime. with food 06/12/17   [provider]  LINZESS 145 MCG CAPS capsule Take 145 mcg by mouth daily. 06/12/17   [provider]  metoprolol tartrate (LOPRESSOR) 25 MG tablet TAKE 1 AND 1/2 TABLETS BY MOUTH TWICE DAILY 02/01/19   Arnoldo Lenis, MD  nortriptyline (PAMELOR) 10 MG capsule Take 40 mg by mouth at bedtime.    [provider]  pantoprazole (PROTONIX) 40 MG tablet Take 40 mg by mouth daily. 06/12/17   [provider]  spironolactone (ALDACTONE) 100 MG tablet Take by mouth at bedtime. 04/23/17   [provider]  [place] 0.18/0.215/0.25 MG-35 MCG tablet Take 1 tablet by mouth daily. 09/23/17   [provider]    Allergies    Sulfa antibiotics and Amoxicillin-pot clavulanate  Review of Systems   Review of Systems  Constitutional: Positive for fever.  Respiratory: Positive for cough and shortness of breath.   Cardiovascular: Positive  for chest pain. Negative for leg swelling.  Gastrointestinal: Negative for abdominal pain, nausea and vomiting.  Genitourinary: Negative for dysuria and hematuria.  Neurological: Negative for headaches.  All other systems reviewed and are negative.   Physical Exam Updated Vital Signs BP 119/72   Pulse 71   Temp 98.4 F (36.9 C) (Oral)   Resp 15   Ht 5\' 3"  (1.6 m)   Wt 81.6 kg   LMP 01/18/2019   SpO2 99%   BMI 31.89 kg/m   Physical Exam Vitals and nursing note reviewed.  Constitutional:      Appearance: Normal appearance. She is well-developed.  HENT:     Head: Normocephalic and atraumatic.  Eyes:     General: Lids are normal.      Conjunctiva/sclera: Conjunctivae normal.     Pupils: Pupils are equal, round, and reactive to light.  Cardiovascular:     Rate and Rhythm: Normal rate and regular rhythm.     Pulses: Normal pulses.     Heart sounds: Normal heart sounds. No murmur. No friction rub. No gallop.   Pulmonary:     Effort: Pulmonary effort is normal.     Breath sounds: Normal breath sounds.     Comments: Lungs clear to auscultation bilaterally.  Symmetric chest rise.  No wheezing, rales, rhonchi. Abdominal:     Palpations: Abdomen is soft. Abdomen is not rigid.     Tenderness: There is no abdominal tenderness. There is no guarding.  Musculoskeletal:        General: Normal range of motion.     Cervical back: Full passive range of motion without pain.     Comments: Bilateral lower extremities are symmetric in appearance without any overlying warmth, erythema, edema.  Skin:    General: Skin is warm and dry.     Capillary Refill: Capillary refill takes less than 2 seconds.  Neurological:     Mental Status: She is alert and oriented to person, place, and time.  Psychiatric:        Speech: Speech normal.     ED Results / Procedures / Treatments   Labs (all labs ordered are listed, but only abnormal results are displayed) Labs Reviewed  BASIC METABOLIC PANEL - Abnormal; Notable for the following components:      Result Value   Potassium 3.4 (*)    Calcium 8.0 (*)    All other components within normal limits  CBC WITH DIFFERENTIAL/PLATELET - Abnormal; Notable for the following components:   WBC 16.7 (*)    RBC 3.82 (*)    Hemoglobin 11.3 (*)    HCT 35.2 (*)    Platelets 515 (*)    Neutro Abs 7.8 (*)    Lymphs Abs 7.0 (*)    Monocytes Absolute 1.4 (*)    All other components within normal limits  URINALYSIS, ROUTINE W REFLEX MICROSCOPIC - Abnormal; Notable for the following components:   Color, Urine STRAW (*)    All other components within normal limits  D-DIMER, QUANTITATIVE (NOT AT [facility])  I-STAT  BETA HCG BLOOD, ED ([facility], [facility], [facility] ONLY)  TROPONIN I (HIGH SENSITIVITY)    EKG EKG Interpretation  Date/Time:  Sunday February 07 2019 19:41:30 EST Ventricular Rate:  70 PR Interval:    QRS Duration: 82 QT Interval:  404 QTC Calculation: 436 R Axis:   85 Text Interpretation: Sinus rhythm Low voltage, precordial leads Nonspecific T abnormalities, lateral leads Confirmed by 12-07-1970 (Donnetta Hutching) on 02/07/2019 8:29:26 PM   Radiology  DG Chest Portable 1 View  Result Date: 02/07/2019 CLINICAL DATA:  Chest pain EXAM: PORTABLE CHEST 1 VIEW COMPARISON:  None. FINDINGS: There are hazy bilateral airspace opacities. There is no pneumothorax. No large pleural effusion. The heart size is normal. There is no acute osseous abnormality. IMPRESSION: Subtle hazy bilateral airspace opacities consistent with the patient's history of viral pneumonia. No pneumothorax or large pleural effusion. Electronically Signed   By: Katherine Mantle M.D.   On: 02/07/2019 19:33    Procedures Procedures (including critical care time)  Medications Ordered in ED Medications  sodium chloride 0.9 % bolus 1,000 mL (0 mLs Intravenous Stopped 02/07/19 2105)  albuterol (VENTOLIN HFA) 108 (90 Base) MCG/ACT inhaler 1-2 puff (2 puffs Inhalation Given 02/07/19 2122)  dexamethasone (DECADRON) injection 4 mg (4 mg Intravenous Given 02/07/19 2122)    ED Course  I have reviewed the triage vital signs and the nursing notes.  Pertinent labs & imaging results that were available during my care of the patient were reviewed by me and considered in my medical decision making (see chart for details).    MDM Rules/Calculators/A&P                      39 year old female who presents for evaluation of 4 to 5 days of cough, congestion, shortness of breath, chest pain, fevers.  She reports she was Covid positive on 01/18/19.  Had been feeling better until last several days.  Initially arrival, she is afebrile, nontoxic-appearing.  Her vitals are  stable.  No evidence of tachycardia or hypoxia.  On exam, no evidence of respiratory distress.  Suspect this may be related to ongoing Covid 19 fracture/symptoms.  She does have risk factor for PE and that she takes oral birth control pills.  Given that I cannot PERC her out, will plan for D-dimer since she is complaining of some dyspnea on exertion.  Low suspicion for ACS etiology.  Plan to check labs, chest x-ray.  I-STAT beta negative.  CBC shows leukocytosis of 16.7.  Hemoglobin stable at 11.3.  D-dimer is negative at 0.35.  Troponin is negative.  BMP shows potassium of 3.4.  Otherwise unremarkable.  UA negative for any infectious etiology.  Chest x-ray shows hazy bilateral airspace opacities.  No pneumothorax.  No large pleural effusion.  Patient ambulated in the emergency department with O2 sats maintaining at 98%.  At this time, do not feel that her symptoms are representative of ACS etiology.  Given negative D-dimer, lack of hypoxia or tachycardia, do not feel that this is representative PE.  Her pneumonia is likely related to her ongoing COVID-19 infection.  Therefore this time, will not give antibiotics.  Discussed results with patient.  She actually has been on prednisone over the last few weeks.  This likely is accounting for her leukocytosis.  I discussed supportive care measures with patient. At this time, patient exhibits no emergent life-threatening condition that require further evaluation in ED or admission. Patient had ample opportunity for questions and discussion. All patient's questions were answered with full understanding. Strict return precautions discussed. Patient expresses understanding and agreement to plan.   RALPH BENAVIDEZ was evaluated in Emergency Department on 02/07/2019 for the symptoms described in the history of present illness. She was evaluated in the context of the global COVID-19 pandemic, which necessitated consideration that the patient might be at risk for  infection with the SARS-CoV-2 virus that causes COVID-19. Institutional protocols and algorithms that pertain to the evaluation  of patients at risk for COVID-19 are in a state of rapid change based on information released by regulatory bodies including the CDC and federal and state organizations. These policies and algorithms were followed during the patient's care in the ED.  Portions of this note were generated with Scientist, clinical (histocompatibility and immunogenetics). Dictation errors may occur despite best attempts at proofreading.  Final Clinical Impression(s) / ED Diagnoses Final diagnoses:  Fever, unspecified fever cause  Pneumonia due to COVID-19 virus  Shortness of breath    Rx / DC Orders ED Discharge Orders    None       Rosana Hoes 02/07/19 2132    Donnetta Hutching, MD 02/12/19 1326

## 2019-07-06 ENCOUNTER — Ambulatory Visit (INDEPENDENT_AMBULATORY_CARE_PROVIDER_SITE_OTHER): Payer: 59 | Admitting: Internal Medicine

## 2019-07-06 ENCOUNTER — Encounter: Payer: Self-pay | Admitting: Internal Medicine

## 2019-07-06 ENCOUNTER — Ambulatory Visit (INDEPENDENT_AMBULATORY_CARE_PROVIDER_SITE_OTHER): Payer: 59

## 2019-07-06 ENCOUNTER — Other Ambulatory Visit: Payer: Self-pay

## 2019-07-06 VITALS — BP 126/74 | HR 72 | Temp 98.6°F | Ht 63.0 in | Wt 182.3 lb

## 2019-07-06 DIAGNOSIS — R059 Cough, unspecified: Secondary | ICD-10-CM

## 2019-07-06 DIAGNOSIS — R05 Cough: Secondary | ICD-10-CM

## 2019-07-06 DIAGNOSIS — U071 COVID-19: Secondary | ICD-10-CM

## 2019-07-06 MED ORDER — FLUTICASONE-SALMETEROL 232-14 MCG/ACT IN AEPB: 1.0000 | INHALATION_SPRAY | Freq: Two times a day (BID) | RESPIRATORY_TRACT | 11 refills | Status: DC

## 2019-07-06 MED ORDER — PREDNISONE 20 MG PO TABS: 20.0000 mg | ORAL_TABLET | Freq: Every day | ORAL | 0 refills | Status: AC

## 2019-07-06 MED ORDER — MONTELUKAST SODIUM 10 MG PO TABS: 10.0000 mg | ORAL_TABLET | Freq: Every day | ORAL | 11 refills | Status: DC

## 2019-07-06 NOTE — Progress Notes (Signed)
Synopsis: Post COVID bronchitis  Assessment & Plan:  Problem 1 Post COVID bronchitis: CXR reassuring.  Worsened by ongoing GERD and sinus issues.  Warrants trial of therapy.  -Check CRP/IgE/CBC -Prednisone x 1 week, salmeterol/fluticasone bid, rinse out mouth after use, montelukast -Check by phone in 1 month, if no improvement will get ILD CT and refer to PM or MR  MDM . I reviewed prior external note(s) from Dr. Lacinda Axon on 02/07/2019 . I reviewed the result(s) of EKG from 02/08/19 that show NSR without ischemic changes . I have ordered above tests  Review of patient's 02/07/19 CXR images revealed bilateral faint infiltrates which have resolved on today's x-ray. The patient's images have been independently reviewed by me.    End of visit medications:  Current Outpatient Medications:  .  citalopram (CELEXA) 20 MG tablet, Take 20 mg by mouth daily., Disp: , Rfl:  .  doxycycline (VIBRA-TABS) 100 MG tablet, Take 100 mg by mouth at bedtime. with food, Disp: , Rfl: 5 .  LINZESS 145 MCG CAPS capsule, Take 145 mcg by mouth daily., Disp: , Rfl: 1 .  metoprolol tartrate (LOPRESSOR) 25 MG tablet, TAKE 1 AND 1/2 TABLETS BY MOUTH TWICE DAILY, Disp: 270 tablet, Rfl: 3 .  nortriptyline (PAMELOR) 10 MG capsule, Take 40 mg by mouth at bedtime., Disp: , Rfl:  .  pantoprazole (PROTONIX) 40 MG tablet, Take 40 mg by mouth daily., Disp: , Rfl: 0 .  spironolactone (ALDACTONE) 100 MG tablet, Take by mouth at bedtime., Disp: , Rfl:  .  TRI-LINYAH 0.18/0.215/0.25 MG-35 MCG tablet, Take 1 tablet by mouth daily., Disp: , Rfl: [place], MD [facility] 07/06/2019 9:21 AM    Subjective:   PATIENT ID: Samantha Fox GENDER: female DOB: 1980-07-28, MRN: [id number]  No chief complaint on file.   HPI Here for post-COVID fatigue and SOB. Cough, mostly nonproductive, worse when supine. DOE and occasional wheezing No fevers/chills +sinus drainage, postnasal drip, incompletely  controlled GERD with PPI Also has some sort of desquamating rash on hand from covid that is lingering she is to see dermatology regarding.  Ancillary information including prior medications, full medical/surgical/family/social histoies, and PFTs (when available) are listed below and have been reviewed.   ROS + symptoms in bold Fevers, chills, weight loss Nausea, vomiting, diarrhea Shortness of breath, wheezing, cough Chest pain, palpitations, lower ext edema   Objective:  There were no vitals filed for this visit.   on RA BMI Readings from Last 3 Encounters:  02/07/19 31.89 kg/m  07/17/18 28.34 kg/m  01/16/18 27.99 kg/m   Wt Readings from Last 3 Encounters:  02/07/19 180 lb (81.6 kg)  07/17/18 160 lb (72.6 kg)  01/16/18 158 lb (71.7 kg)    GEN: well appearing woman in no acute distress HEENT: trachea midline, mucus membranes dry and irritated CV: Regular rate and rhythm, extremities are warm PULM: clear without wheezing or accessory muscle use GI: Soft, +BS EXT: no edema, left hand wrapped without strikethrough NEURO: Moves all 4 extremities   Ancillary Information    Past Medical History:  Diagnosis Date  . Acid reflux   . Anxiety   . High cholesterol   . Migraines   . Tendonitis      No family history on file.   Past Surgical History:  Procedure Laterality Date  . CHOLECYSTECTOMY    . TUBAL LIGATION      Social History   Socioeconomic History  . Marital status:  Married    Spouse name: Not on file  . Number of children: Not on file  . Years of education: Not on file  . Highest education level: Not on file  Occupational History  . Not on file  Tobacco Use  . Smoking status: Never Smoker  . Smokeless tobacco: Never Used  Vaping Use  . Vaping Use: Never used  Substance and Sexual Activity  . Alcohol use: No  . Drug use: No  . Sexual activity: Not on file  Other Topics Concern  . Not on file  Social History Narrative  . Not on file    Social Determinants of Health   Financial Resource Strain:   . Difficulty of Paying Living Expenses:   Food Insecurity:   . Worried About Programme researcher, broadcasting/film/video in the Last Year:   . Barista in the Last Year:   Transportation Needs:   . Freight forwarder (Medical):   Marland Kitchen Lack of Transportation (Non-Medical):   Physical Activity:   . Days of Exercise per Week:   . Minutes of Exercise per Session:   Stress:   . Feeling of Stress :   Social Connections:   . Frequency of Communication with Friends and Family:   . Frequency of Social Gatherings with Friends and Family:   . Attends Religious Services:   . Active Member of Clubs or Organizations:   . Attends Banker Meetings:   Marland Kitchen Marital Status:   Intimate Partner Violence:   . Fear of Current or Ex-Partner:   . Emotionally Abused:   Marland Kitchen Physically Abused:   . Sexually Abused:      Allergies  Allergen Reactions  . Sulfa Antibiotics Hives  . Amoxicillin-Pot Clavulanate Nausea Only     CBC    Component Value Date/Time   WBC 16.7 (H) 02/07/2019 1932   RBC 3.82 (L) 02/07/2019 1932   HGB 11.3 (L) 02/07/2019 1932   HCT 35.2 (L) 02/07/2019 1932   PLT 515 (H) 02/07/2019 1932   MCV 92.1 02/07/2019 1932   MCH 29.6 02/07/2019 1932   MCHC 32.1 02/07/2019 1932   RDW 13.2 02/07/2019 1932   LYMPHSABS 7.0 (H) 02/07/2019 1932   MONOABS 1.4 (H) 02/07/2019 1932   EOSABS 0.2 02/07/2019 1932   BASOSABS 0.1 02/07/2019 1932    Pulmonary Functions Testing Results: No flowsheet data found.  Outpatient Medications Prior to Visit  Medication Sig Dispense Refill  . citalopram (CELEXA) 20 MG tablet Take 20 mg by mouth daily.    Marland Kitchen doxycycline (VIBRA-TABS) 100 MG tablet Take 100 mg by mouth at bedtime. with food  5  . LINZESS 145 MCG CAPS capsule Take 145 mcg by mouth daily.  1  . metoprolol tartrate (LOPRESSOR) 25 MG tablet TAKE 1 AND 1/2 TABLETS BY MOUTH TWICE DAILY 270 tablet 3  . nortriptyline (PAMELOR) 10 MG  capsule Take 40 mg by mouth at bedtime.    . pantoprazole (PROTONIX) 40 MG tablet Take 40 mg by mouth daily.  0  . spironolactone (ALDACTONE) 100 MG tablet Take by mouth at bedtime.    Nuala Alpha 0.18/0.215/0.25 MG-35 MCG tablet Take 1 tablet by mouth daily.  5   No facility-administered medications prior to visit.

## 2019-07-06 NOTE — Patient Instructions (Signed)
-   Lab tests - Montelukast and new inhaler, rinse mouth out after using - Continue zyrtec and flonase - Short course of low dose steroid - Touch base by phone with me in 1 month

## 2019-07-06 NOTE — Addendum Note (Signed)
Addended by: Demetrio Lapping E on: 07/06/2019 03:57 PM   Modules accepted: Orders

## 2019-07-07 LAB — CBC WITH DIFFERENTIAL/PLATELET
Basophils Absolute: 0.1 10*3/uL (ref 0.0–0.1)
Basophils Relative: 0.8 % (ref 0.0–3.0)
Eosinophils Absolute: 0.4 10*3/uL (ref 0.0–0.7)
Eosinophils Relative: 4.3 % (ref 0.0–5.0)
HCT: 38.9 % (ref 36.0–46.0)
Hemoglobin: 12.9 g/dL (ref 12.0–15.0)
Lymphocytes Relative: 16.9 % (ref 12.0–46.0)
Lymphs Abs: 1.5 10*3/uL (ref 0.7–4.0)
MCHC: 33.2 g/dL (ref 30.0–36.0)
MCV: 89.6 fl (ref 78.0–100.0)
Monocytes Absolute: 1 10*3/uL (ref 0.1–1.0)
Monocytes Relative: 11.2 % (ref 3.0–12.0)
Neutro Abs: 6 10*3/uL (ref 1.4–7.7)
Neutrophils Relative %: 66.8 % (ref 43.0–77.0)
Platelets: 388 10*3/uL (ref 150.0–400.0)
RBC: 4.35 Mil/uL (ref 3.87–5.11)
RDW: 13.2 % (ref 11.5–15.5)
WBC: 9 10*3/uL (ref 4.0–10.5)

## 2019-07-07 LAB — IGE: IgE (Immunoglobulin E), Serum: 96 kU/L (ref <=114)

## 2019-07-07 LAB — C-REACTIVE PROTEIN: CRP: <1 mg/dL (ref 0.5–20.0)

## 2019-08-04 DIAGNOSIS — J45909 Unspecified asthma, uncomplicated: Secondary | ICD-10-CM | POA: Insufficient documentation

## 2019-08-05 ENCOUNTER — Telehealth: Payer: Self-pay | Admitting: Internal Medicine

## 2019-08-05 NOTE — Telephone Encounter (Signed)
Spoke with pt. She wanted to call and let us know that Singulair and Fluticasone-Salmeterol are working well for her. Nothing further was needed at this time.

## 2019-09-30 IMAGING — CT CT ABD-PELV W/ CM
2 of 4 series · 14 of 46 positions shown, 16 images · IV contrast (Isovue)
Comparison: May 19, 2015

CLINICAL DATA: Lower abdominal pain, primarily right-sided.
Dysuria. Vomiting.

EXAM:
CT ABDOMEN AND PELVIS WITH CONTRAST
TECHNIQUE: Multidetector CT imaging of the abdomen and pelvis was performed
using the standard protocol following bolus administration of
intravenous contrast.
CONTRAST:  100mL BB1YWB-IYY IOPAMIDOL (BB1YWB-IYY) INJECTION 61%

[Series 2: axial st · axial · 0.70mm/px · z∈[+538,+938]mm · 11 of 94 slices shown, 13 images]
[im 9/94  soft-tissue]
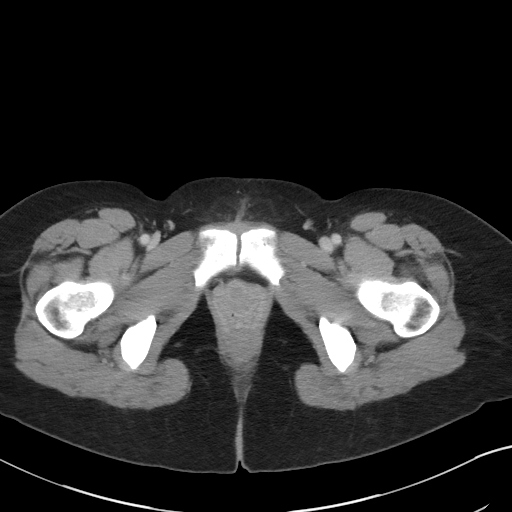
[im 9/94  bone]
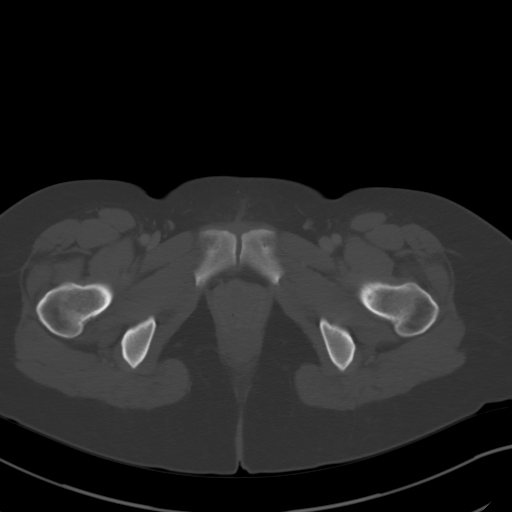
[im 17/94  soft-tissue]
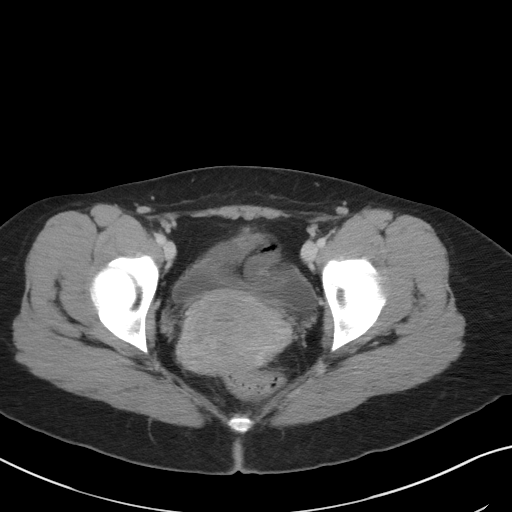
[im 25/94  soft-tissue]
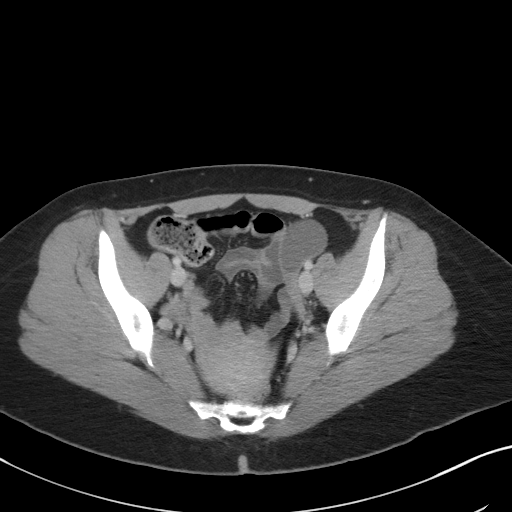
[im 33/94  soft-tissue]
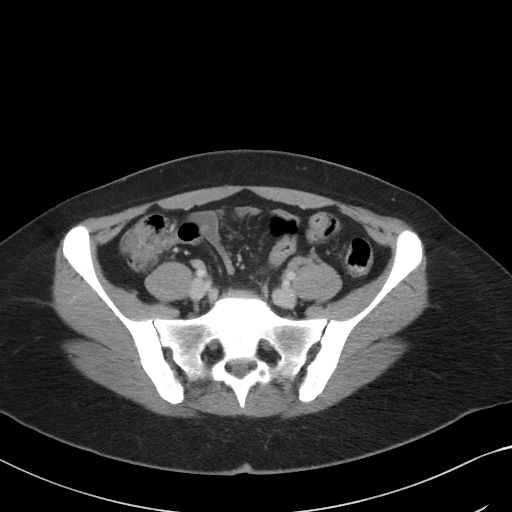
[im 41/94  soft-tissue]
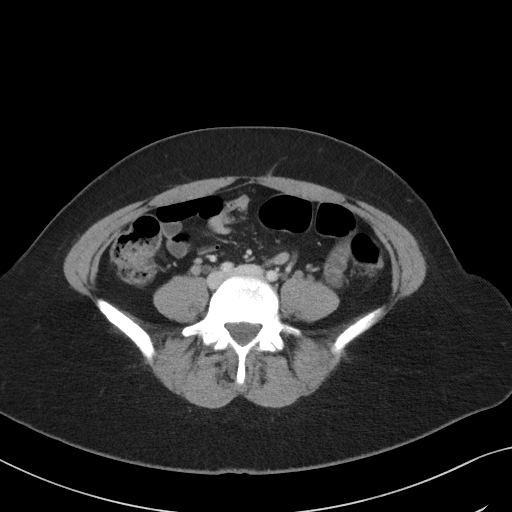
[im 49/94  soft-tissue]
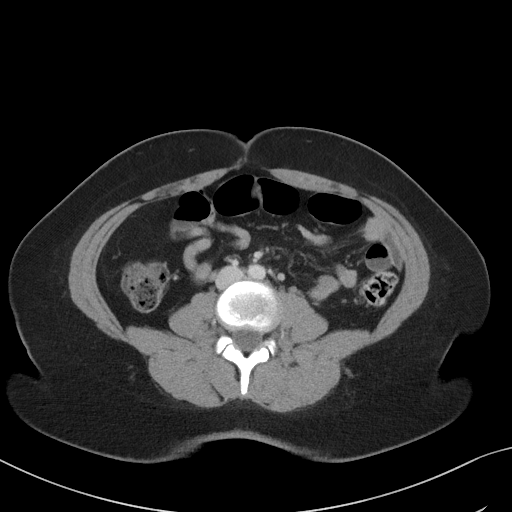
[im 57/94  soft-tissue]
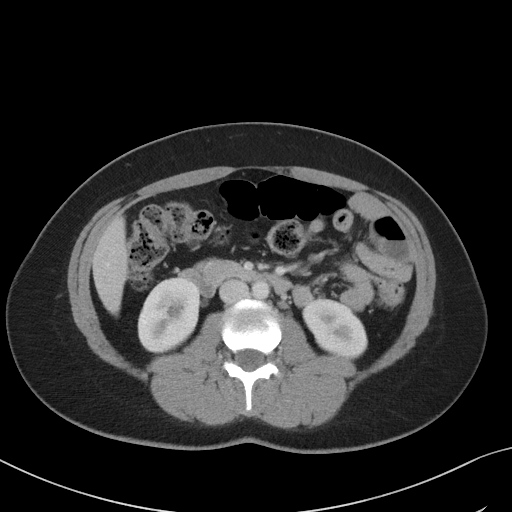
[im 65/94  soft-tissue]
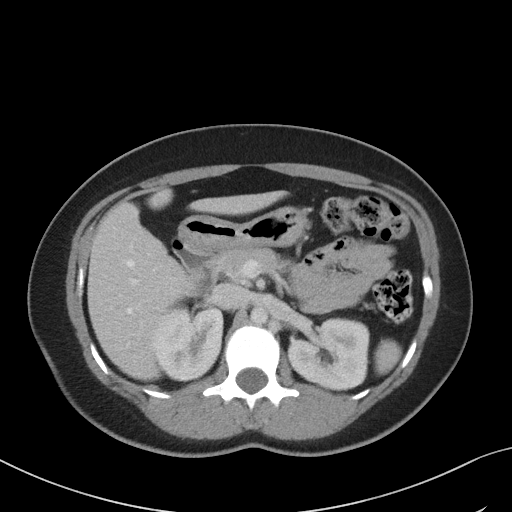
[im 73/94  soft-tissue]
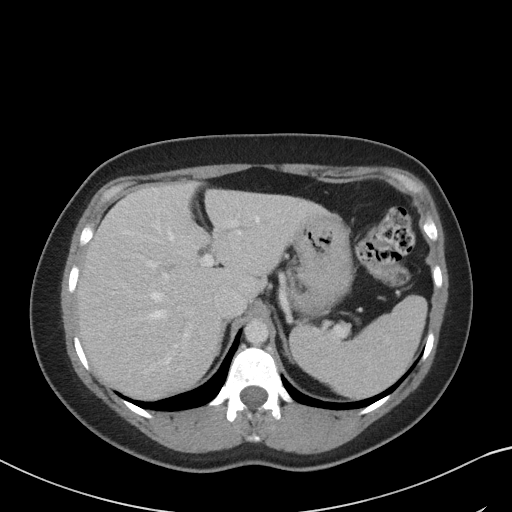
[im 73/94  bone]
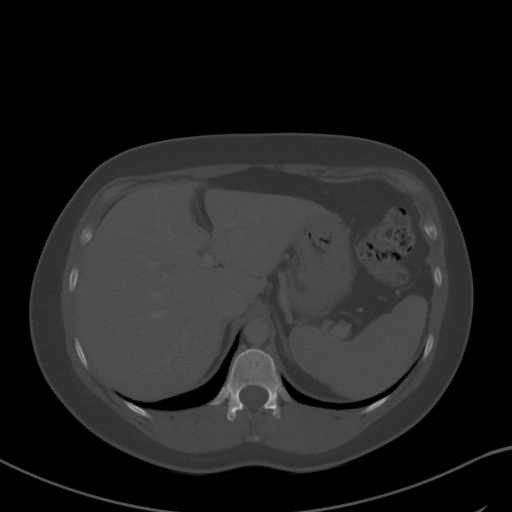
[im 81/94  soft-tissue]
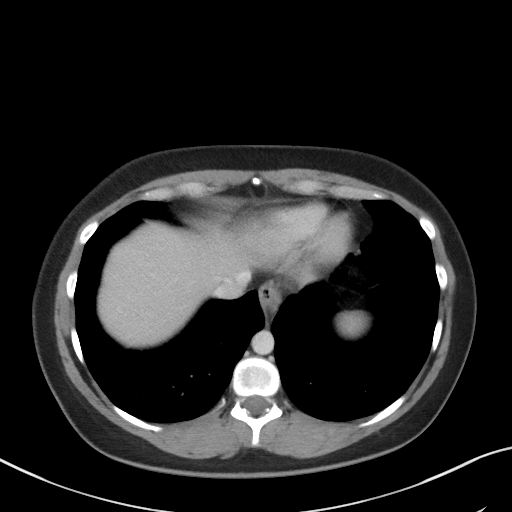
[im 89/94  soft-tissue]
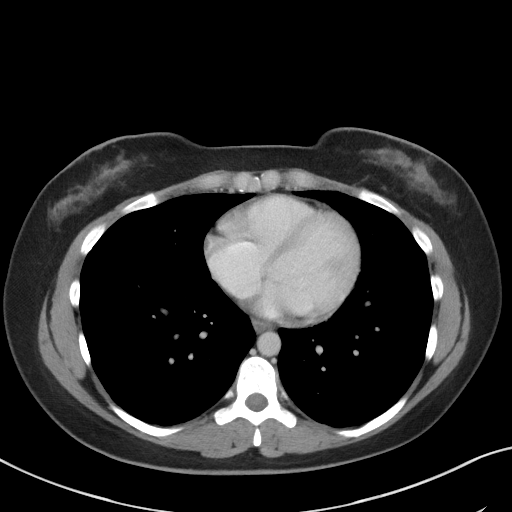

[Series 5: coronal st · coronal · 0.63mm/px · 3 of 81 slices shown]
[im 27/81  soft-tissue]
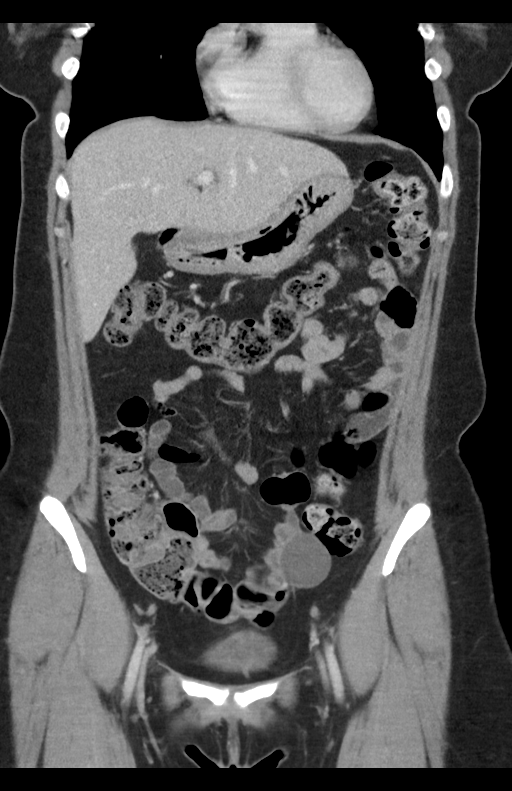
[im 36/81  soft-tissue]
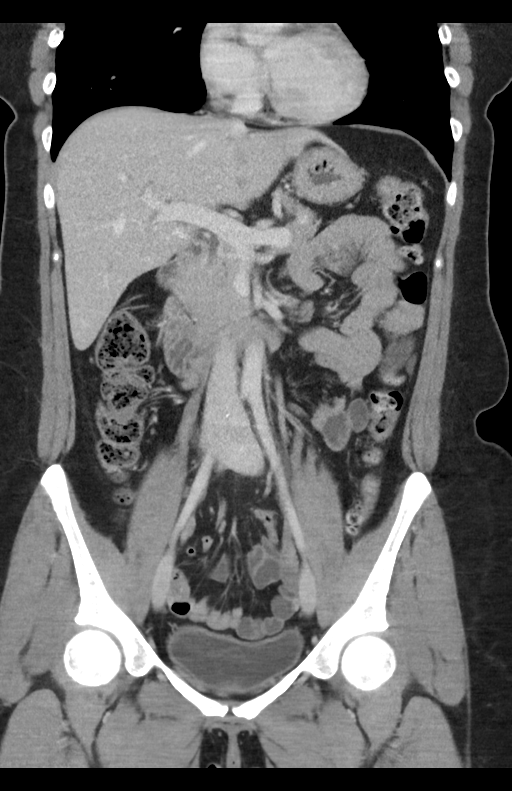
[im 45/81  soft-tissue]
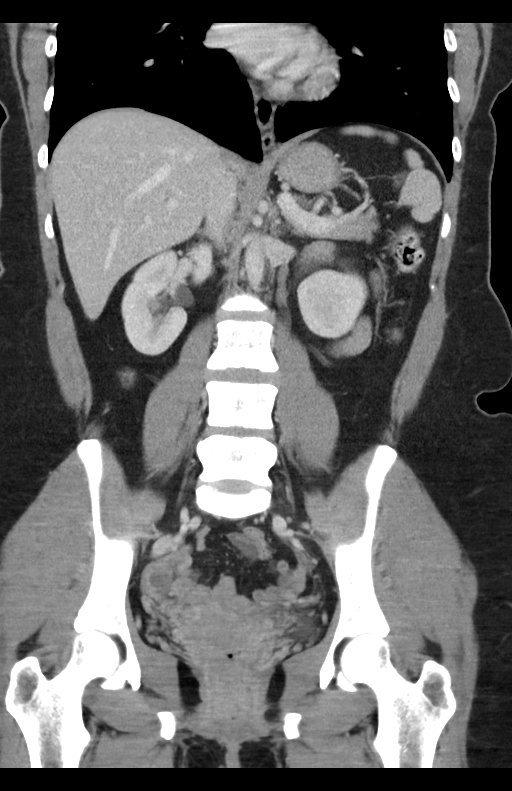

[14 of 46 positions shown; findings below may reference images not displayed]

FINDINGS: Lower chest: Lung bases are clear.

Hepatobiliary: No focal liver lesions are apparent. Gallbladder is
absent. There is no appreciable biliary duct dilatation.

Pancreas: There is no pancreatic mass or inflammatory focus.

Spleen: No splenic lesions are evident.

Adrenals/Urinary Tract: Adrenals bilaterally appear unremarkable.
Kidneys bilaterally show no evident mass or hydronephrosis on either
side. There is no appreciable renal or ureteral calculus on either
side. Urinary bladder is midline with wall thickness within normal
limits.

Stomach/Bowel: There is no appreciable bowel wall or mesenteric
thickening. No evident bowel obstruction. No free air or portal
venous air.

Vascular/Lymphatic: There is no abdominal aortic aneurysm. No
vascular lesions are appreciable. There is no appreciable adenopathy
in the abdomen or pelvis.

Reproductive: Uterus is retroverted. There is a cystic area in the
mid left pelvis measuring 4.0 x 2.9 x 3.7 cm. No other pelvic masses
evident. It is difficult to ascertain whether this cystic area
arises eccentrically from the left adnexa versus arises in the
mesentery apart from the adnexa. It appears somewhat more peripheral
than is generally seen with an adnexal cystic area. No other evident
pelvic mass. Tubal ligation clips are noted in the right pelvis,
similar to prior study.

Other: The appendix appears normal. There is no periappendiceal
region inflammatory change. There is no abscess or ascites in the
abdomen or pelvis. There is a small ventral hernia containing only
fat.

Musculoskeletal: There are no blastic or lytic bone lesions. No
intramuscular lesions are evident.
IMPRESSION: 1. There is a cystic area in the left pelvis measuring 4.0 x 2.9 x
3.7 cm. Suspect mesenteric cyst, as this cystic area appears more
peripherally located than is generally appreciated with adnexal
cystic mass. Correlation with pelvic ultrasound including Doppler
assessment in this regard advised to further evaluate.

2. As noted previously, tubal ligation clips are to the right of
midline. Question left-sided clip displacement to the right side.

3. Appendix appears normal. No evident abscess in the abdomen or
pelvis. No bowel obstruction.

4.  No evident renal or ureteral calculus.  No hydronephrosis.

5.  Gallbladder absent.

6.  Small ventral hernia containing only fat.

## 2019-11-03 ENCOUNTER — Ambulatory Visit: Payer: 59 | Admitting: Cardiology

## 2019-11-17 NOTE — Progress Notes (Deleted)
Cardiology Office Note  Date: 11/17/2019   ID: LEYNA VANDERKOLK, DOB [birth year], MRN [id number]  PCP:  Selinda Flavin, MD  Cardiologist:  Dina Rich, MD Electrophysiologist:  None   Chief Complaint: Palpitations  History of Present Illness: DREANNA KYLLO is a 40 y.o. female with a history of palpitations, GERD, HLD, anxiety, migraines, bronchitis, Covid infection.  Last encounter with Dr. Wyline Mood 07/17/2018 via telemedicine visit.  Had palpitations on and off for several years.  Could feel lightheaded and have some shortness of breath.  At a prior visit Lopressor was increased to 37.5 mg p.o. twice daily.  She denied any recent significant symptoms.  Holter monitor showed some PVCs but no significant arrhythmias  Past Medical History:  Diagnosis Date  . Acid reflux   . Anxiety   . High cholesterol   . Migraines   . Tendonitis     Past Surgical History:  Procedure Laterality Date  . CHOLECYSTECTOMY    . TUBAL LIGATION      Current Outpatient Medications  Medication Sig Dispense Refill  . cetirizine (ZYRTEC) 10 MG tablet Take 10 mg by mouth daily.    . citalopram (CELEXA) 20 MG tablet Take 40 mg by mouth daily.     . Fluticasone-Salmeterol 232-14 MCG/ACT AEPB Inhale 1 Inhaler into the lungs in the morning and at bedtime. 1 each 11  . metoprolol tartrate (LOPRESSOR) 25 MG tablet TAKE 1 AND 1/2 TABLETS BY MOUTH TWICE DAILY 270 tablet 3  . montelukast (SINGULAIR) 10 MG tablet Take 1 tablet (10 mg total) by mouth at bedtime. 30 tablet 11  . pantoprazole (PROTONIX) 40 MG tablet Take 40 mg by mouth daily.  0  . simvastatin (ZOCOR) 20 MG tablet SMARTSIG:1 Tablet(s) By Mouth Every Evening    . spironolactone (ALDACTONE) 100 MG tablet Take by mouth at bedtime.     No current facility-administered medications for this visit.   Allergies:  Sulfa antibiotics and Amoxicillin-pot clavulanate   Social History: The patient  reports that she has never smoked. She has never used  smokeless tobacco. She reports that she does not drink alcohol and does not use drugs.   Family History: The patient's family history is not on file.   ROS:  Please see the history of present illness. Otherwise, complete review of systems is positive for {NONE DEFAULTED:18576::"none"}.  All other systems are reviewed and negative.   Physical Exam: VS:  There were no vitals taken for this visit., BMI There is no height or weight on file to calculate BMI.  Wt Readings from Last 3 Encounters:  07/06/19 182 lb 4.8 oz (82.7 kg)  02/07/19 180 lb (81.6 kg)  07/17/18 160 lb (72.6 kg)    General: Patient appears comfortable at rest. HEENT: Conjunctiva and lids normal, oropharynx clear with moist mucosa. Neck: Supple, no elevated JVP or carotid bruits, no thyromegaly. Lungs: Clear to auscultation, nonlabored breathing at rest. Cardiac: Regular rate and rhythm, no S3 or significant systolic murmur, no pericardial rub. Abdomen: Soft, nontender, no hepatomegaly, bowel sounds present, no guarding or rebound. Extremities: No pitting edema, distal pulses 2+. Skin: Warm and dry. Musculoskeletal: No kyphosis. Neuropsychiatric: Alert and oriented x3, affect grossly appropriate.  ECG:  {EKG/Telemetry Strips Reviewed:[phone]}  Recent Labwork: 02/07/2019: BUN 12; Creatinine, Ser 0.92; Potassium 3.4; Sodium 138 07/06/2019: Hemoglobin 12.9; Platelets 388.0  No results found for: CHOL, TRIG, HDL, CHOLHDL, VLDL, LDLCALC, LDLDIRECT  Other Studies Reviewed Today:  Holter monitor 07/03/2017 Study Highlights   24  hr holter monitor  Min HR 59, Max HR 159, Avg HR 93  No supreventricular ectopy  Occasisional ventricular ectopy, 6% burden. Primarily in the the form of singlets, couplts, bigeminy.  No diary submitted  No significant arrhythmias  Assessment and Plan:  1. Palpitations    1. Palpitations ***  Medication Adjustments/Labs and Tests Ordered: Current medicines are reviewed at length  with the patient today.  Concerns regarding medicines are outlined above.   Disposition: Follow-up with ***  Signed, Rennis Harding, NP 11/17/2019 7:27 PM    [facility] [address] [place], [place], [place] [address] Phone: [phone]; Fax: [phone]

## 2019-11-18 ENCOUNTER — Ambulatory Visit: Payer: 59 | Admitting: Family Medicine

## 2019-11-18 DIAGNOSIS — R002 Palpitations: Secondary | ICD-10-CM

## 2019-11-30 ENCOUNTER — Other Ambulatory Visit: Payer: Self-pay | Admitting: Cardiology

## 2020-02-07 ENCOUNTER — Telehealth: Payer: Self-pay

## 2020-02-07 NOTE — Telephone Encounter (Signed)
Received a PA request for Fluticasone-Salmeterol 232-100mcg. Entered in the information for PA but received a message that a PA is not needed for inhaler. Will close PA and this encounter.

## 2020-03-14 ENCOUNTER — Other Ambulatory Visit: Payer: Self-pay | Admitting: Cardiology

## 2020-03-17 ENCOUNTER — Other Ambulatory Visit: Payer: Self-pay | Admitting: Cardiology

## 2020-05-04 ENCOUNTER — Ambulatory Visit: Payer: 59 | Admitting: Cardiology

## 2020-05-04 NOTE — Progress Notes (Deleted)
Clinical Summary Samantha Fox is a 40 y.o.female  seen today for follow up of the following medical problems.  1. Palpitations -off and on for several years. Feeling of heart pounding, can feel lightheaded. Can have some SOB - increased frequency over the last month.  - can occur rest or with activity. Can last up to 5 minutes. - no coffee, no sodas, occasional tea, no energy drinks, no EtOH. - pcp shows SR with PVCs - K 4.8 - rides bike two times a week x to 1 hr, tolerates well  - 06/2017 holter with only occasional PVCs, no significant arrhythmias  - last visit we increased lopressor to 37.5mg  bid. Denies any significant symptoms recently.  Past Medical History:  Diagnosis Date  . Acid reflux   . Anxiety   . High cholesterol   . Migraines   . Tendonitis      Allergies  Allergen Reactions  . Sulfa Antibiotics Hives  . Amoxicillin-Pot Clavulanate Nausea Only     Current Outpatient Medications  Medication Sig Dispense Refill  . cetirizine (ZYRTEC) 10 MG tablet Take 10 mg by mouth daily.    . citalopram (CELEXA) 20 MG tablet Take 40 mg by mouth daily.     . Fluticasone-Salmeterol 232-14 MCG/ACT AEPB Inhale 1 Inhaler into the lungs in the morning and at bedtime. 1 each 11  . metoprolol tartrate (LOPRESSOR) 25 MG tablet TAKE 1 AND 1/2 TABLETS BY MOUTH TWICE DAILY, patient needs appointment FOR further refills PER dr. Dewon Mendizabal 7 tablet 0  . montelukast (SINGULAIR) 10 MG tablet Take 1 tablet (10 mg total) by mouth at bedtime. 30 tablet 11  . pantoprazole (PROTONIX) 40 MG tablet Take 40 mg by mouth daily.  0  . simvastatin (ZOCOR) 20 MG tablet SMARTSIG:1 Tablet(s) By Mouth Every Evening    . spironolactone (ALDACTONE) 100 MG tablet Take by mouth at bedtime.     No current facility-administered medications for this visit.     Past Surgical History:  Procedure Laterality Date  . CHOLECYSTECTOMY    . TUBAL LIGATION       Allergies  Allergen Reactions   . Sulfa Antibiotics Hives  . Amoxicillin-Pot Clavulanate Nausea Only      No family history on file.   Social History Ms. Mehlberg reports that she has never smoked. She has never used smokeless tobacco. Ms. Devincentis reports no history of alcohol use.   Review of Systems CONSTITUTIONAL: No weight loss, fever, chills, weakness or fatigue.  HEENT: Eyes: No visual loss, blurred vision, double vision or yellow sclerae.No hearing loss, sneezing, congestion, runny nose or sore throat.  SKIN: No rash or itching.  CARDIOVASCULAR:  RESPIRATORY: No shortness of breath, cough or sputum.  GASTROINTESTINAL: No anorexia, nausea, vomiting or diarrhea. No abdominal pain or blood.  GENITOURINARY: No burning on urination, no polyuria NEUROLOGICAL: No headache, dizziness, syncope, paralysis, ataxia, numbness or tingling in the extremities. No change in bowel or bladder control.  MUSCULOSKELETAL: No muscle, back pain, joint pain or stiffness.  LYMPHATICS: No enlarged nodes. No history of splenectomy.  PSYCHIATRIC: No history of depression or anxiety.  ENDOCRINOLOGIC: No reports of sweating, cold or heat intolerance. No polyuria or polydipsia.  Marland Kitchen   Physical Examination There were no vitals filed for this visit. There were no vitals filed for this visit.  Gen: resting comfortably, no acute distress HEENT: no scleral icterus, pupils equal round and reactive, no palptable cervical adenopathy,  CV Resp: Clear to auscultation bilaterally  GI: abdomen is soft, non-tender, non-distended, normal bowel sounds, no hepatosplenomegaly MSK: extremities are warm, no edema.  Skin: warm, no rash Neuro:  no focal deficits Psych: appropriate affect   Diagnostic Studies 06/2017 2r h holter  24 hr holter monitor  Min HR 59, Max HR 159, Avg HR 93  No supreventricular ectopy  Occasisional ventricular ectopy, 6% burden. Primarily in the the form of singlets, couplts, bigeminy.  No diary submitted  No  significant arrhythmias     Assessment and Plan  1. Palpitations -holter shows some PVCs, no significant arrhythmias. -doing well on lopressor 37.5mg  bid, continue current meds      Antoine Poche, M.D.

## 2020-06-11 ENCOUNTER — Other Ambulatory Visit: Payer: Self-pay | Admitting: Internal Medicine

## 2020-06-28 ENCOUNTER — Emergency Department (HOSPITAL_COMMUNITY)
Admission: EM | Admit: 2020-06-28 | Discharge: 2020-06-29 | Disposition: A | Discharge status: Home / Self Care | Payer: 59 | Attending: Emergency Medicine | Admitting: Emergency Medicine

## 2020-06-28 ENCOUNTER — Encounter (HOSPITAL_COMMUNITY): Payer: Self-pay

## 2020-06-28 ENCOUNTER — Other Ambulatory Visit: Payer: Self-pay

## 2020-06-28 DIAGNOSIS — K0889 Other specified disorders of teeth and supporting structures: Secondary | ICD-10-CM | POA: Diagnosis present

## 2020-06-28 DIAGNOSIS — K047 Periapical abscess without sinus: Secondary | ICD-10-CM | POA: Insufficient documentation

## 2020-06-28 MED ORDER — LIDOCAINE VISCOUS HCL 2 % MT SOLN
15.0000 mL | Freq: Once | OROMUCOSAL | Status: AC
  Administered 2020-06-28: 23:00:00 15 mL via ORAL
  Filled 2020-06-28: qty 15

## 2020-06-28 NOTE — ED Triage Notes (Signed)
Pt to er, pt states that she is here because she has a blister on the roof of her mouth, states that it started today

## 2020-06-28 NOTE — ED Notes (Signed)
Lidocaine at bedside per Dr. Pilar Plate request.

## 2020-06-29 MED ORDER — PENICILLIN V POTASSIUM 250 MG PO TABS
500.0000 mg | ORAL_TABLET | Freq: Once | ORAL | Status: AC
  Administered 2020-06-29: 500 mg via ORAL
  Filled 2020-06-29: qty 2

## 2020-06-29 MED ORDER — PENICILLIN V POTASSIUM 500 MG PO TABS: 500.0000 mg | ORAL_TABLET | Freq: Four times a day (QID) | ORAL | 0 refills | Status: AC

## 2020-06-29 NOTE — Discharge Instructions (Addendum)
You were evaluated in the Emergency Department and after careful evaluation, we did not find any emergent condition requiring admission or further testing in the hospital.  Your exam/testing today was overall reassuring.  Symptoms seem to be due to a dental abscess, which we drained here in the emergency department.  Please take the antibiotics as directed and follow-up with your dentist.  Please return to the Emergency Department if you experience any worsening of your condition.  Thank you for allowing [place] to be a part of your care.

## 2020-06-29 NOTE — ED Provider Notes (Signed)
AP-EMERGENCY DEPT [facility] Emergency Department Provider Note MRN:  [id number]  Arrival date & time: 06/29/20     Chief Complaint   Oral Pain   History of Present Illness   Samantha Fox is a 40 y.o. year-old female with no pertinent past medical history presenting to the ED with chief complaint of oral pain.  Patient has follow-up with a dentist in July, has a number of decaying teeth that may need to be extracted.  Has a painful blister or soreness on the roof of the mouth near her teeth that has been bothering her all day today.  Becoming more more painful.  Denies any fever, no other complaints.  Pain is moderate to severe.  Review of Systems  A problem-focused ROS was performed. Positive for oral pain.  Patient denies fever.  Patient's Health History    Past Medical History:  Diagnosis Date  . Acid reflux   . Anxiety   . High cholesterol   . Migraines   . Tendonitis     Past Surgical History:  Procedure Laterality Date  . CHOLECYSTECTOMY    . TUBAL LIGATION      History reviewed. No pertinent family history.  Social History   Socioeconomic History  . Marital status: Married    Spouse name: Not on file  . Number of children: Not on file  . Years of education: Not on file  . Highest education level: Not on file  Occupational History  . Not on file  Tobacco Use  . Smoking status: Never Smoker  . Smokeless tobacco: Never Used  Vaping Use  . Vaping Use: Never used  Substance and Sexual Activity  . Alcohol use: No  . Drug use: No  . Sexual activity: Not on file  Other Topics Concern  . Not on file  Social History Narrative  . Not on file   Social Determinants of Health   Financial Resource Strain: Not on file  Food Insecurity: Not on file  Transportation Needs: Not on file  Physical Activity: Not on file  Stress: Not on file  Social Connections: Not on file  Intimate Partner Violence: Not on file     Physical Exam   Vitals:    06/28/20 2114  BP: 132/78  Pulse: 84  Resp: 18  Temp: 99.1 F (37.3 C)  SpO2: 100%    CONSTITUTIONAL: Well-appearing, NAD NEURO:  Alert and oriented x 3, no focal deficits EYES:  eyes equal and reactive ENT/NECK:  no LAD, no JVD; fluctuant tender area to the hard palate adjacent to teeth 8 and 9 CARDIO: Regular rate, well-perfused, normal S1 and S2 PULM:  CTAB no wheezing or rhonchi GI/GU:  normal bowel sounds, non-distended, non-tender MSK/SPINE:  No gross deformities, no edema SKIN:  no rash, atraumatic PSYCH:  Appropriate speech and behavior  *Additional and/or pertinent findings included in MDM below  Diagnostic and Interventional Summary    EKG Interpretation  Date/Time:    Ventricular Rate:    PR Interval:    QRS Duration:   QT Interval:    QTC Calculation:   R Axis:     Text Interpretation:        Labs Reviewed - No data to display  No orders to display    Medications  penicillin v potassium (VEETID) tablet 500 mg (has no administration in time range)  lidocaine (XYLOCAINE) 2 % viscous mouth solution 15 mL (15 mLs Oral Given 06/28/20 2321)     Procedures  /  Critical Care .Marland KitchenIncision and Drainage  Date/Time: 06/29/2020 12:08 AM Performed by: Sabas Sous, MD Authorized by: Sabas Sous, MD   Consent:    Consent obtained:  Verbal   Consent given by:  Patient   Risks, benefits, and alternatives were discussed: yes     Risks discussed:  Bleeding, damage to other organs, incomplete drainage, infection and pain   Alternatives discussed:  No treatment Universal protocol:    Procedure explained and questions answered to patient or proxy's satisfaction: yes     Immediately prior to procedure, a time out was called: yes     Patient identity confirmed:  Verbally with patient Location:    Type:  Abscess   Size:  2cm   Location:  Mouth   Mouth location:  Palate Sedation:    Sedation type:  None Anesthesia:    Anesthesia method:  Topical application    Topical anesthetic:  Lidocaine gel Procedure type:    Complexity:  Simple Procedure details:    Incision types:  Single straight   Incision depth:  Submucosal   Drainage:  Purulent and bloody   Drainage amount:  Moderate   Wound treatment:  Wound left open   Packing materials:  None Post-procedure details:    Procedure completion:  Tolerated well, no immediate complications    ED Course and Medical Decision Making  I have reviewed the triage vital signs, the nursing notes, and pertinent available records from the EMR.  Listed above are laboratory and imaging tests that I personally ordered, reviewed, and interpreted and then considered in my medical decision making (see below for details).  Dental abscess to the palate, drained as described above.  Normal vital signs, tolerated well, appropriate for discharge on antibiotics with dental follow-up.       Elmer Sow. Pilar Plate, MD [facility] Emergency Medicine [facility] [email or web address]  Final Clinical Impressions(s) / ED Diagnoses     ICD-10-CM   1. Dental abscess  K04.7     ED Discharge Orders         Ordered    penicillin v potassium (VEETID) 500 MG tablet  4 times daily        06/29/20 0007           Discharge Instructions Discussed with and Provided to Patient:     Discharge Instructions     You were evaluated in the Emergency Department and after careful evaluation, we did not find any emergent condition requiring admission or further testing in the hospital.  Your exam/testing today was overall reassuring.  Symptoms seem to be due to a dental abscess, which we drained here in the emergency department.  Please take the antibiotics as directed and follow-up with your dentist.  Please return to the Emergency Department if you experience any worsening of your condition.  Thank you for allowing [place] to be a part of your care.       Sabas Sous, MD 06/29/20 Burna Mortimer

## 2020-07-17 ENCOUNTER — Ambulatory Visit: Payer: 59 | Admitting: Pulmonary Disease

## 2020-08-09 ENCOUNTER — Other Ambulatory Visit: Payer: Self-pay | Admitting: Internal Medicine

## 2020-12-13 ENCOUNTER — Ambulatory Visit: Payer: 59 | Admitting: Family Medicine

## 2020-12-13 NOTE — Progress Notes (Deleted)
Cardiology Office Note  Date: 12/13/2020   ID: Samantha Fox, DOB [birth year], MRN [id number]  PCP:  Selinda Flavin, MD  Cardiologist:  Dina Rich, MD Electrophysiologist:  None   Chief Complaint: 1 year follow-up  History of Present Illness: Samantha Fox is a 40 y.o. female with a history of potation/PVCs, acid reflux .  Last seen by Dr. Wyline Mood on 07/17/2018 via telemedicine.  She had been having palpitations on and off for several years with feeling of heart pounding, lightheaded with some shortness of breath.  Previous Holter monitor June 2019 with occasional PVCs no significant arrhythmias.  Lopressor had previously been increased to 37.5 mg p.o. twice daily without any recent symptoms.     Past Medical History:  Diagnosis Date   Acid reflux    Anxiety    High cholesterol    Migraines    Tendonitis     Past Surgical History:  Procedure Laterality Date   CHOLECYSTECTOMY     TUBAL LIGATION      Current Outpatient Medications  Medication Sig Dispense Refill   cetirizine (ZYRTEC) 10 MG tablet Take 10 mg by mouth daily.     citalopram (CELEXA) 20 MG tablet Take 40 mg by mouth daily.      Fluticasone-Salmeterol 232-14 MCG/ACT AEPB Inhale 1 Inhaler into the lungs in the morning and at bedtime. 1 each 11   metoprolol tartrate (LOPRESSOR) 25 MG tablet TAKE 1 AND 1/2 TABLETS BY MOUTH TWICE DAILY, patient needs appointment FOR further refills PER dr. branch 7 tablet 0   montelukast (SINGULAIR) 10 MG tablet TAKE 1 TABLET BY MOUTH AT BEDTIME 30 tablet 1   pantoprazole (PROTONIX) 40 MG tablet Take 40 mg by mouth daily.  0   simvastatin (ZOCOR) 20 MG tablet SMARTSIG:1 Tablet(s) By Mouth Every Evening     spironolactone (ALDACTONE) 100 MG tablet Take by mouth at bedtime.     No current facility-administered medications for this visit.   Allergies:  Sulfa antibiotics and Amoxicillin-pot clavulanate   Social History: The patient  reports that she has never smoked.  She has never used smokeless tobacco. She reports that she does not drink alcohol and does not use drugs.   Family History: The patient's family history is not on file.   ROS:  Please see the history of present illness. Otherwise, complete review of systems is positive for {NONE DEFAULTED:18576}.  All other systems are reviewed and negative.   Physical Exam: VS:  There were no vitals taken for this visit., BMI There is no height or weight on file to calculate BMI.  Wt Readings from Last 3 Encounters:  06/28/20 195 lb (88.5 kg)  07/06/19 182 lb 4.8 oz (82.7 kg)  02/07/19 180 lb (81.6 kg)    General: Patient appears comfortable at rest. HEENT: Conjunctiva and lids normal, oropharynx clear with moist mucosa. Neck: Supple, no elevated JVP or carotid bruits, no thyromegaly. Lungs: Clear to auscultation, nonlabored breathing at rest. Cardiac: Regular rate and rhythm, no S3 or significant systolic murmur, no pericardial rub. Abdomen: Soft, nontender, no hepatomegaly, bowel sounds present, no guarding or rebound. Extremities: No pitting edema, distal pulses 2+. Skin: Warm and dry. Musculoskeletal: No kyphosis. Neuropsychiatric: Alert and oriented x3, affect grossly appropriate.  ECG:  {EKG/Telemetry Strips [phone]}  Recent Labwork: No results found for requested labs within last 8760 hours.  No results found for: CHOL, TRIG, HDL, CHOLHDL, VLDL, LDLCALC, LDLDIRECT  Other Studies Reviewed Today:   06/2017 2r h holter  24 hr holter monitor Min HR 59, Max HR 159, Avg HR 93 No supreventricular ectopy Occasisional ventricular ectopy, 6% burden. Primarily in the the form of singlets, couplts, bigeminy. No diary submitted No significant arrhythmias  Assessment and Plan:  1. Palpitations      Medication Adjustments/Labs and Tests Ordered: Current medicines are reviewed at length with the patient today.  Concerns regarding medicines are outlined above.   Disposition:  Follow-up with ***  Signed, Rennis Harding, NP 12/13/2020 2:57 PM    [facility] [address] [place], [place], [place] [address] Phone: [phone]; Fax: [phone]

## 2021-01-16 ENCOUNTER — Other Ambulatory Visit (HOSPITAL_COMMUNITY): Payer: Self-pay | Admitting: Family Medicine

## 2021-01-16 DIAGNOSIS — M7122 Synovial cyst of popliteal space [Baker], left knee: Secondary | ICD-10-CM

## 2021-01-19 ENCOUNTER — Ambulatory Visit (HOSPITAL_COMMUNITY)
Admission: RE | Admit: 2021-01-19 | Discharge: 2021-01-19 | Disposition: A | Discharge status: Home / Self Care | Payer: 59 | Source: Ambulatory Visit | Attending: Family Medicine | Admitting: Family Medicine

## 2021-01-19 ENCOUNTER — Other Ambulatory Visit: Payer: Self-pay

## 2021-01-19 DIAGNOSIS — M7122 Synovial cyst of popliteal space [Baker], left knee: Secondary | ICD-10-CM | POA: Insufficient documentation

## 2021-01-27 ENCOUNTER — Encounter (HOSPITAL_COMMUNITY): Payer: Self-pay

## 2021-01-27 ENCOUNTER — Other Ambulatory Visit: Payer: Self-pay

## 2021-01-27 ENCOUNTER — Emergency Department (HOSPITAL_COMMUNITY): Payer: Self-pay

## 2021-01-27 ENCOUNTER — Emergency Department (HOSPITAL_COMMUNITY)
Admission: EM | Admit: 2021-01-27 | Discharge: 2021-01-27 | Disposition: A | Discharge status: Home / Self Care | Payer: Self-pay | Attending: Emergency Medicine | Admitting: Emergency Medicine

## 2021-01-27 DIAGNOSIS — R0600 Dyspnea, unspecified: Secondary | ICD-10-CM | POA: Insufficient documentation

## 2021-01-27 DIAGNOSIS — F419 Anxiety disorder, unspecified: Secondary | ICD-10-CM | POA: Insufficient documentation

## 2021-01-27 DIAGNOSIS — E876 Hypokalemia: Secondary | ICD-10-CM | POA: Insufficient documentation

## 2021-01-27 DIAGNOSIS — R0789 Other chest pain: Secondary | ICD-10-CM | POA: Insufficient documentation

## 2021-01-27 DIAGNOSIS — D75839 Thrombocytosis, unspecified: Secondary | ICD-10-CM | POA: Insufficient documentation

## 2021-01-27 DIAGNOSIS — Z79899 Other long term (current) drug therapy: Secondary | ICD-10-CM | POA: Insufficient documentation

## 2021-01-27 LAB — CBC WITH DIFFERENTIAL/PLATELET
Abs Immature Granulocytes: 0.04 10*3/uL (ref 0.00–0.07)
Basophils Absolute: 0.1 10*3/uL (ref 0.0–0.1)
Basophils Relative: 1 %
Eosinophils Absolute: 0.6 10*3/uL — ABNORMAL HIGH (ref 0.0–0.5)
Eosinophils Relative: 5 %
HCT: 40.3 % (ref 36.0–46.0)
Hemoglobin: 12.9 g/dL (ref 12.0–15.0)
Immature Granulocytes: 0 %
Lymphocytes Relative: 39 %
Lymphs Abs: 4.6 10*3/uL — ABNORMAL HIGH (ref 0.7–4.0)
MCH: 27.9 pg (ref 26.0–34.0)
MCHC: 32 g/dL (ref 30.0–36.0)
MCV: 87 fL (ref 80.0–100.0)
Monocytes Absolute: 1.1 10*3/uL — ABNORMAL HIGH (ref 0.1–1.0)
Monocytes Relative: 10 %
Neutro Abs: 5.2 10*3/uL (ref 1.7–7.7)
Neutrophils Relative %: 45 %
Platelets: 586 10*3/uL — ABNORMAL HIGH (ref 150–400)
RBC: 4.63 MIL/uL (ref 3.87–5.11)
RDW: 14.5 % (ref 11.5–15.5)
WBC: 11.7 10*3/uL — ABNORMAL HIGH (ref 4.0–10.5)
nRBC: 0 % (ref 0.0–0.2)

## 2021-01-27 LAB — BASIC METABOLIC PANEL
Anion gap: 8 (ref 5–15)
BUN: 13 mg/dL (ref 6–20)
CO2: 25 mmol/L (ref 22–32)
Calcium: 8.7 mg/dL — ABNORMAL LOW (ref 8.9–10.3)
Chloride: 102 mmol/L (ref 98–111)
Creatinine, Ser: 0.95 mg/dL (ref 0.44–1.00)
GFR, Estimated: >60 mL/min (ref >60)
Glucose, Bld: 110 mg/dL — ABNORMAL HIGH (ref 70–99)
Potassium: 3.4 mmol/L — ABNORMAL LOW (ref 3.5–5.1)
Sodium: 135 mmol/L (ref 135–145)

## 2021-01-27 LAB — TROPONIN I (HIGH SENSITIVITY)
Troponin I (High Sensitivity): 3 ng/L (ref <18)
Troponin I (High Sensitivity): 2 ng/L (ref <18)

## 2021-01-27 LAB — D-DIMER, QUANTITATIVE: D-Dimer, Quant: 1.36 ug/mL-FEU — ABNORMAL HIGH (ref 0.00–0.50)

## 2021-01-27 MED ORDER — IPRATROPIUM-ALBUTEROL 0.5-2.5 (3) MG/3ML IN SOLN
3.0000 mL | Freq: Once | RESPIRATORY_TRACT | Status: AC
  Administered 2021-01-27: 3 mL via RESPIRATORY_TRACT
  Filled 2021-01-27: qty 3

## 2021-01-27 MED ORDER — ALBUTEROL SULFATE HFA 108 (90 BASE) MCG/ACT IN AERS: 2.0000 | INHALATION_SPRAY | RESPIRATORY_TRACT | 0 refills | Status: AC | PRN

## 2021-01-27 MED ORDER — POTASSIUM CHLORIDE CRYS ER 20 MEQ PO TBCR
40.0000 meq | EXTENDED_RELEASE_TABLET | Freq: Once | ORAL | Status: AC
  Administered 2021-01-27: 40 meq via ORAL
  Filled 2021-01-27: qty 2

## 2021-01-27 MED ORDER — ASPIRIN 81 MG PO CHEW
324.0000 mg | CHEWABLE_TABLET | Freq: Once | ORAL | Status: AC
  Administered 2021-01-27: 324 mg via ORAL
  Filled 2021-01-27: qty 4

## 2021-01-27 MED ORDER — IOHEXOL 350 MG/ML SOLN
100.0000 mL | Freq: Once | INTRAVENOUS | Status: AC | PRN
  Administered 2021-01-27: 100 mL via INTRAVENOUS

## 2021-01-27 MED ORDER — PANTOPRAZOLE SODIUM 40 MG PO TBEC
40.0000 mg | DELAYED_RELEASE_TABLET | Freq: Once | ORAL | Status: AC
  Administered 2021-01-27: 40 mg via ORAL
  Filled 2021-01-27: qty 1

## 2021-01-27 MED ORDER — PREDNISONE 50 MG PO TABS: 50.0000 mg | ORAL_TABLET | Freq: Every day | ORAL | 0 refills | Status: DC

## 2021-01-27 MED ORDER — PREDNISONE 50 MG PO TABS
60.0000 mg | ORAL_TABLET | Freq: Once | ORAL | Status: AC
  Administered 2021-01-27: 60 mg via ORAL
  Filled 2021-01-27: qty 1

## 2021-01-27 MED ORDER — ALUM & MAG HYDROXIDE-SIMETH 200-200-20 MG/5ML PO SUSP
30.0000 mL | Freq: Once | ORAL | Status: AC
  Administered 2021-01-27: 30 mL via ORAL
  Filled 2021-01-27: qty 30

## 2021-01-27 NOTE — ED Provider Notes (Signed)
[facility] EMERGENCY DEPART[facility] Provider Note   CSN: [id number] Arrival date & time: 01/27/21  0000     History  Chief Complaint  Patient presents with   Chest Pain    Samantha Fox is a 41 y.o. female.  The history is provided by the patient.  Chest Pain She has history of hyperlipidemia, GERD and comes in because of a heavy feeling in her chest which started this evening.  Onset was at rest and it is moderately severe.  She rates it at 8/10.  There is radiation to the her back into her right arm.  There is mild dysnea.  She did have nausea and vomited once.  She denies diaphoresis.  Nothing makes her pain better, nothing makes it worse.  She has not done anything to try to treated at home.  She has never had similar symptoms previously.  She is a non-smoker and denies history of diabetes or hypertension.  She had previously been diagnosed with hyperlipidemia, but has been taken off of her medication.  There is no family history of premature coronary atherosclerosis.  She denies any history of recent surgery, recent travel, use of exogenous estrogens, history of DVT or pulmonary embolism.   Home Medications Prior to Admission medications   Medication Sig Start Date End Date Taking? Authorizing Provider  cetirizine (ZYRTEC) 10 MG tablet Take 10 mg by mouth daily. 07/01/19   [provider]  citalopram (CELEXA) 20 MG tablet Take 40 mg by mouth daily.     [provider]  Fluticasone-Salmeterol 232-14 MCG/ACT AEPB Inhale 1 Inhaler into the lungs in the morning and at bedtime. 07/06/19   Lorin GlassSmith, Daniel C, MD  ibuprofen (ADVIL) 800 MG tablet Take 800 mg by mouth every 6 (six) hours as needed. 12/21/20   [provider]  metoprolol tartrate (LOPRESSOR) 25 MG tablet TAKE 1 AND 1/2 TABLETS BY MOUTH TWICE DAILY, patient needs appointment FOR further refills PER dr. Wyline Moodbranch 03/14/20   Antoine PocheBranch, Jonathan F, MD  montelukast (SINGULAIR) 10 MG tablet TAKE 1 TABLET BY MOUTH AT  BEDTIME 06/12/20   Lorin GlassSmith, Daniel C, MD  pantoprazole (PROTONIX) 40 MG tablet Take 40 mg by mouth daily. 06/12/17   [provider]  propranolol ER (INDERAL LA) 60 MG 24 hr capsule SMARTSIG:1 Capsule(s) By Mouth Every Evening 01/09/21   [provider]  simvastatin (ZOCOR) 20 MG tablet SMARTSIG:1 Tablet(s) By Mouth Every Evening 07/01/19   [provider]  spironolactone (ALDACTONE) 100 MG tablet Take by mouth at bedtime. 04/23/17   [provider]      Allergies    Sulfa antibiotics and Amoxicillin-pot clavulanate    Review of Systems   Review of Systems  Cardiovascular:  Positive for chest pain.  All other systems reviewed and are negative.  Physical Exam Updated Vital Signs BP 120/78 (BP Location: Left Arm)    Pulse 82    Temp 98.6 F (37 C) (Oral)    Resp 17    Ht 5\' 3"  (1.6 m)    Wt 89.8 kg    LMP 01/21/2021    SpO2 100%    BMI 35.07 kg/m  Physical Exam Vitals and nursing note reviewed.  41 year old female, resting comfortably and in no acute distress. Vital signs are normal. Oxygen saturation is 100%, which is normal. Head is normocephalic and atraumatic. PERRLA, EOMI. Oropharynx is clear. Neck is nontender and supple without adenopathy or JVD. Back is nontender and there is no CVA tenderness.  Lungs are clear without rales, wheezes, or rhonchi.  There is a slightly prolonged exhalation phase, and faint wheezing is heard with forced exhalation. Chest is nontender. Heart has regular rate and rhythm without murmur. Abdomen is soft, flat, nontender without masses or hepatosplenomegaly and peristalsis is normoactive. Extremities have no cyanosis or edema, full range of motion is present. Skin is warm and dry without rash. Neurologic: Mental status is normal, cranial nerves are intact, there are no motor or sensory deficits.  ED Results / Procedures / Treatments   Labs (all labs ordered are listed, but only abnormal results are displayed) Labs  Reviewed  BASIC METABOLIC PANEL - Abnormal; Notable for the following components:      Result Value   Potassium 3.4 (*)    Glucose, Bld 110 (*)    Calcium 8.7 (*)    All other components within normal limits  CBC WITH DIFFERENTIAL/PLATELET - Abnormal; Notable for the following components:   WBC 11.7 (*)    Platelets 586 (*)    Lymphs Abs 4.6 (*)    Monocytes Absolute 1.1 (*)    Eosinophils Absolute 0.6 (*)    All other components within normal limits  D-DIMER, QUANTITATIVE - Abnormal; Notable for the following components:   D-Dimer, Quant 1.36 (*)    All other components within normal limits  TROPONIN I (HIGH SENSITIVITY)  TROPONIN I (HIGH SENSITIVITY)    EKG EKG Interpretation  Date/Time:  Saturday January 27 2021 00:22:07 EST Ventricular Rate:  76 PR Interval:  146 QRS Duration: 85 QT Interval:  373 QTC Calculation: 420 R Axis:   17 Text Interpretation: Sinus rhythm Low voltage, precordial leads Nonspecific T abnormalities, anterior leads When compared with ECG of 02/07/2019, No significant change was found Confirmed by Dione Booze ([id number]) on 01/27/2021 12:25:46 AM  Radiology CT Angio Chest PE W and/or Wo Contrast  Result Date: 01/27/2021 CLINICAL DATA:  Concern for pulmonary embolism. EXAM: CT ANGIOGRAPHY CHEST WITH CONTRAST TECHNIQUE: Multidetector CT imaging of the chest was performed using the standard protocol during bolus administration of intravenous contrast. Multiplanar CT image reconstructions and MIPs were obtained to evaluate the vascular anatomy. CONTRAST:  OMNIPAQUE IOHEXOL 350 MG/ML SOLN COMPARISON:  Chest radiograph dated 01/27/2021. FINDINGS: Cardiovascular: There is no cardiomegaly or pericardial effusion. The thoracic aorta is unremarkable. Evaluation of the pulmonary arteries is limited due to respiratory motion artifact. No pulmonary artery embolus identified. Mediastinum/Nodes: No hilar or mediastinal adenopathy. There is a small hiatal hernia. The  esophagus is grossly unremarkable. No mediastinal fluid collection. Lungs/Pleura: The lungs are clear. There is no pleural effusion pneumothorax. The central airways are patent. Upper Abdomen: No acute abnormality. Musculoskeletal: No acute osseous pathology. Review of the MIP images confirms the above findings. IMPRESSION: No acute intrathoracic pathology. No CT evidence of pulmonary embolism. Electronically Signed   By: Elgie Collard M.D.   On: 01/27/2021 03:28   DG Chest Port 1 View  Result Date: 01/27/2021 CLINICAL DATA:  Chest pain. EXAM: PORTABLE CHEST 1 VIEW COMPARISON:  July 06, 2019 FINDINGS: The heart size and mediastinal contours are within normal limits. Both lungs are clear. The visualized skeletal structures are unremarkable. IMPRESSION: No active disease. Electronically Signed   By: Aram Candela M.D.   On: 01/27/2021 01:18    Procedures Procedures    Medications Ordered in ED Medications  predniSONE (DELTASONE) tablet 60 mg (has no administration in time range)  pantoprazole (PROTONIX) EC tablet 40 mg (has no administration in time range)  ipratropium-albuterol (DUONEB) 0.5-2.5 (3) MG/3ML nebulizer solution 3 mL (3 mLs Nebulization Given 01/27/21 0867)  aspirin chewable tablet 324 mg (324 mg Oral Given 01/27/21 0238)  potassium chloride SA (KLOR-CON M) CR tablet 40 mEq (40 mEq Oral Given 01/27/21 0240)  alum & mag hydroxide-simeth (MAALOX/MYLANTA) 200-200-20 MG/5ML suspension 30 mL (30 mLs Oral Given 01/27/21 0239)  iohexol (OMNIPAQUE) 350 MG/ML injection 100 mL (100 mLs Intravenous Contrast Given 01/27/21 0314)    ED Course/ Medical Decision Making/ A&P                           Medical Decision Making  Chest discomfort and patient who would be a very low risk for coronary artery disease.  Physical exam is suggestive of bronchospasm.  ECG shows no acute changes, nonspecific T wave abnormalities have been present previously.  Old records are reviewed, and she has no relevant  past visits.  Heart score is 1 which puts her at low risk for major adverse cardiac events in the next 6 weeks.  She will be given aspirin and will try nebulizer treatment with albuterol and ipratropium.  Labs showed normal troponin, mild hypokalemia, mild to moderate thrombocytosis of uncertain cause.  She is given a dose of oral potassium.  Chest x-ray shows no acute process.  Images were independently viewed by myself, and I agree with radiologist's interpretation.  She had no response to albuterol with ipratropium, we will try a GI cocktail and.  Although she has no risk factors, will check D-dimer to screen for pulmonary embolism.  D-dimer is elevated, CT angiogram is ordered to rule out pulmonary embolism.  Patient states that there was some improvement with her GI cocktail.  CT angiogram shows no acute process.  I have independently viewed the images, and agree with radiologist's interpretation.  Patient now states that she actually did get some relief with albuterol and ipratropium.  She appears to have a combination of acid reflux and some degree of bronchospasm.  She is given a dose of prednisone and is discharged with prescriptions for a 5-day course of prednisone as well as albuterol inhaler.  She is advised to increase her pantoprazole to twice a day for the next 2 weeks.  Follow-up with PCP, return precautions discussed.        Final Clinical Impression(s) / ED Diagnoses Final diagnoses:  Atypical chest pain  Hypokalemia  Thrombocytosis    Rx / DC Orders ED Discharge Orders          Ordered    predniSONE (DELTASONE) 50 MG tablet  Daily        01/27/21 0456    albuterol (VENTOLIN HFA) 108 (90 Base) MCG/ACT inhaler  Every 4 hours PRN        01/27/21 0456              Dione Booze, MD 01/27/21 0502

## 2021-01-27 NOTE — ED Triage Notes (Signed)
Pt arrived from home via POV w c/o chest pain that is radiating down right arm 9/10 on pain scale and feels like pressure

## 2021-01-27 NOTE — Discharge Instructions (Addendum)
Your evaluation did not show signs of anything serious.  This may represent worsening of your underlying acid reflux.  Please increase your pantoprazole (Protonix) to twice a day for the next 2 weeks.  You may take antacids as needed, you may also use the inhaler as needed.  Return if symptoms are getting worse.

## 2021-01-30 ENCOUNTER — Ambulatory Visit: Payer: 59 | Admitting: Pulmonary Disease

## 2021-02-15 ENCOUNTER — Ambulatory Visit: Payer: Self-pay | Admitting: Family Medicine

## 2021-02-16 ENCOUNTER — Ambulatory Visit: Payer: Self-pay | Admitting: Pulmonary Disease

## 2021-10-31 ENCOUNTER — Emergency Department (HOSPITAL_COMMUNITY)
Admission: EM | Admit: 2021-10-31 | Discharge: 2021-10-31 | Disposition: A | Discharge status: Home / Self Care | Payer: Self-pay | Attending: Student | Admitting: Student

## 2021-10-31 ENCOUNTER — Emergency Department (HOSPITAL_COMMUNITY): Payer: Self-pay

## 2021-10-31 ENCOUNTER — Encounter (HOSPITAL_COMMUNITY): Payer: Self-pay | Admitting: Emergency Medicine

## 2021-10-31 ENCOUNTER — Other Ambulatory Visit: Payer: Self-pay

## 2021-10-31 DIAGNOSIS — E876 Hypokalemia: Secondary | ICD-10-CM | POA: Insufficient documentation

## 2021-10-31 DIAGNOSIS — R002 Palpitations: Secondary | ICD-10-CM | POA: Insufficient documentation

## 2021-10-31 DIAGNOSIS — R42 Dizziness and giddiness: Secondary | ICD-10-CM | POA: Insufficient documentation

## 2021-10-31 DIAGNOSIS — R519 Headache, unspecified: Secondary | ICD-10-CM | POA: Insufficient documentation

## 2021-10-31 DIAGNOSIS — I479 Paroxysmal tachycardia, unspecified: Secondary | ICD-10-CM

## 2021-10-31 DIAGNOSIS — R000 Tachycardia, unspecified: Secondary | ICD-10-CM | POA: Insufficient documentation

## 2021-10-31 LAB — CBC WITH DIFFERENTIAL/PLATELET
Abs Immature Granulocytes: 0.02 10*3/uL (ref 0.00–0.07)
Basophils Absolute: 0.1 10*3/uL (ref 0.0–0.1)
Basophils Relative: 1 %
Eosinophils Absolute: 0.1 10*3/uL (ref 0.0–0.5)
Eosinophils Relative: 1 %
HCT: 41.6 % (ref 36.0–46.0)
Hemoglobin: 14.4 g/dL (ref 12.0–15.0)
Immature Granulocytes: 0 %
Lymphocytes Relative: 28 %
Lymphs Abs: 2.3 10*3/uL (ref 0.7–4.0)
MCH: 31 pg (ref 26.0–34.0)
MCHC: 34.6 g/dL (ref 30.0–36.0)
MCV: 89.5 fL (ref 80.0–100.0)
Monocytes Absolute: 0.8 10*3/uL (ref 0.1–1.0)
Monocytes Relative: 10 %
Neutro Abs: 4.9 10*3/uL (ref 1.7–7.7)
Neutrophils Relative %: 60 %
Platelets: 449 10*3/uL — ABNORMAL HIGH (ref 150–400)
RBC: 4.65 MIL/uL (ref 3.87–5.11)
RDW: 12.9 % (ref 11.5–15.5)
WBC: 8.1 10*3/uL (ref 4.0–10.5)
nRBC: 0 % (ref 0.0–0.2)

## 2021-10-31 LAB — BASIC METABOLIC PANEL
Anion gap: 10 (ref 5–15)
BUN: 13 mg/dL (ref 6–20)
CO2: 21 mmol/L — ABNORMAL LOW (ref 22–32)
Calcium: 9.6 mg/dL (ref 8.9–10.3)
Chloride: 105 mmol/L (ref 98–111)
Creatinine, Ser: 0.71 mg/dL (ref 0.44–1.00)
GFR, Estimated: >60 mL/min (ref >60)
Glucose, Bld: 88 mg/dL (ref 70–99)
Potassium: 3.3 mmol/L — ABNORMAL LOW (ref 3.5–5.1)
Sodium: 136 mmol/L (ref 135–145)

## 2021-10-31 LAB — TROPONIN I (HIGH SENSITIVITY)
Troponin I (High Sensitivity): <2 ng/L (ref <18)
Troponin I (High Sensitivity): 2 ng/L (ref <18)

## 2021-10-31 LAB — I-STAT BETA HCG BLOOD, ED (MC, WL, AP ONLY): I-stat hCG, quantitative: <5 m[IU]/mL (ref <5)

## 2021-10-31 LAB — MAGNESIUM: Magnesium: 2 mg/dL (ref 1.7–2.4)

## 2021-10-31 LAB — BRAIN NATRIURETIC PEPTIDE: B Natriuretic Peptide: 4 pg/mL (ref 0.0–100.0)

## 2021-10-31 LAB — T4, FREE: Free T4: 1.01 ng/dL (ref 0.61–1.12)

## 2021-10-31 LAB — D-DIMER, QUANTITATIVE: D-Dimer, Quant: 0.42 ug/mL-FEU (ref 0.00–0.50)

## 2021-10-31 LAB — TSH: TSH: 1.112 u[IU]/mL (ref 0.350–4.500)

## 2021-10-31 MED ORDER — SODIUM CHLORIDE 0.9 % IV BOLUS
1000.0000 mL | Freq: Once | INTRAVENOUS | Status: AC
  Administered 2021-10-31: 1000 mL via INTRAVENOUS

## 2021-10-31 MED ORDER — POTASSIUM CHLORIDE CRYS ER 20 MEQ PO TBCR
40.0000 meq | EXTENDED_RELEASE_TABLET | Freq: Once | ORAL | Status: AC
  Administered 2021-10-31: 40 meq via ORAL
  Filled 2021-10-31: qty 2

## 2021-10-31 NOTE — ED Triage Notes (Signed)
Pt c/o high hr and low bp x 40month, sees pcp oct 19 for symptoms. C/o headache today. Nad. Ambulatory. No resp distress/sob noted. Color wnl. Non diaphoertic. HR is 130 with stethoscope. C/o symptoms worse with standing. C/o sob sometimes.

## 2021-10-31 NOTE — ED Provider Notes (Signed)
[facility] EMERGENCY DEPARTMENT Provider Note   CSN: [id number] Arrival date & time: 10/31/21  1354    History  Chief Complaint  Patient presents with   Headache    Samantha Fox is a 41 y.o. female past medical history here for evaluation of tachycardia and feeling unwell.  She has had symptoms over the last month however worsened over the last few days.  Associated lightheadedness, chest tightness and shortness of breath.  Has some palpitations.  Typically occurs when she goes from lying to sitting sitting to standing.  No history of PE or DVT.  No recent surgery, mobilization or malignancy.  No history of thyroid disorders.  Does not drink excessive caffeine.  Never had anything like this previously.  No recent injury or trauma.  She describes her chest tightness as a pressure sensation when her heart races.  Have a mild headache today.  No recent trauma, no sudden onset thunderclap headache.  No back pain, fever, cough, abdominal pain.  Denies chance of pregnancy.  No meds PTA.  She has appointment to see her PCP in 1 week for her symptoms.  She is never been assessed by cardiology.  HPI     Home Medications Prior to Admission medications   Medication Sig Start Date End Date Taking? Authorizing Provider  albuterol (VENTOLIN HFA) 108 (90 Base) MCG/ACT inhaler Inhale 2 puffs into the lungs every 4 (four) hours as needed for wheezing or shortness of breath (or cough or chest tightness). 01/27/21  Yes Dione Booze, MD  cetirizine (ZYRTEC) 10 MG tablet Take 10 mg by mouth daily. 07/01/19  Yes [provider]  DULoxetine (CYMBALTA) 60 MG capsule Take 60 mg by mouth daily. 08/22/21  Yes [provider]  ibuprofen (ADVIL) 800 MG tablet Take 800 mg by mouth every 6 (six) hours as needed. 12/21/20  Yes [provider]  LINZESS 145 MCG CAPS capsule Take 145 mcg by mouth daily. 08/22/21  Yes [provider]  montelukast (SINGULAIR) 10 MG tablet TAKE 1 TABLET BY  MOUTH AT BEDTIME 06/12/20  Yes Lorin Glass, MD  pantoprazole (PROTONIX) 40 MG tablet Take 40 mg by mouth daily. 06/12/17  Yes [provider]  propranolol ER (INDERAL LA) 60 MG 24 hr capsule Take 60 mg by mouth every evening. 01/09/21  Yes [provider]  Semaglutide, 2 MG/DOSE, (OZEMPIC, 2 MG/DOSE,) 8 MG/3ML SOPN  09/17/21  Yes [provider]  OZEMPIC, 0.25 OR 0.5 MG/DOSE, 2 MG/3ML SOPN SMARTSIG:0.25 Milliliter(s) Topical Once a Week 05/08/21   [provider]      Allergies    Sulfa antibiotics and Amoxicillin-pot clavulanate    Review of Systems   Review of Systems  Constitutional: Negative.   HENT: Negative.    Eyes: Negative.   Respiratory:  Positive for chest tightness and shortness of breath.   Cardiovascular:  Positive for palpitations. Negative for chest pain and leg swelling.  Gastrointestinal: Negative.   Genitourinary: Negative.   Musculoskeletal: Negative.   Skin: Negative.   Neurological:  Positive for light-headedness.  All other systems reviewed and are negative.   Physical Exam Updated Vital Signs BP 103/61   Pulse 88   Temp 97.8 F (36.6 C) (Oral)   Resp 19   LMP 10/12/2021   SpO2 100%  Physical Exam Vitals and nursing note reviewed.  Constitutional:      General: She is not in acute distress.    Appearance: She is well-developed. She is not ill-appearing, toxic-appearing  or diaphoretic.  HENT:     Head: Normocephalic and atraumatic.     Jaw: There is normal jaw occlusion.  Eyes:     Pupils: Pupils are equal, round, and reactive to light.  Cardiovascular:     Rate and Rhythm: Tachycardia present.     Pulses: Normal pulses.          Radial pulses are 2+ on the right side and 2+ on the left side.       Dorsalis pedis pulses are 2+ on the right side and 2+ on the left side.     Heart sounds: Normal heart sounds.     Comments: No murmurs, rubs or gallops.  Tachycardia to 110-118 in room Pulmonary:     Effort:  Pulmonary effort is normal. No respiratory distress.     Breath sounds: Normal breath sounds and air entry.     Comments: Clear bil speaks in full sentences without difficulty Chest:     Comments: Nontender chest wall Abdominal:     General: There is no distension.  Musculoskeletal:        General: No swelling or tenderness. Normal range of motion.     Cervical back: Normal range of motion and neck supple.     Comments: No bony tenderness, full range of motion, compartment soft  Skin:    General: Skin is warm and dry.     Capillary Refill: Capillary refill takes less than 2 seconds.  Neurological:     General: No focal deficit present.     Mental Status: She is alert.  Psychiatric:        Mood and Affect: Mood normal.    ED Results / Procedures / Treatments   Labs (all labs ordered are listed, but only abnormal results are displayed) Labs Reviewed  CBC WITH DIFFERENTIAL/PLATELET - Abnormal; Notable for the following components:      Result Value   Platelets 449 (*)    All other components within normal limits  BASIC METABOLIC PANEL - Abnormal; Notable for the following components:   Potassium 3.3 (*)    CO2 21 (*)    All other components within normal limits  D-DIMER, QUANTITATIVE  BRAIN NATRIURETIC PEPTIDE  TSH  MAGNESIUM  T4, FREE  I-STAT BETA HCG BLOOD, ED ([facility], [facility], [facility] ONLY)  TROPONIN I (HIGH SENSITIVITY)  TROPONIN I (HIGH SENSITIVITY)    EKG None  Radiology DG Chest Port 1 View  Result Date: 10/31/2021 CLINICAL DATA:  Shortness of breath with headache and elevated heart rate. EXAM: PORTABLE CHEST 1 VIEW COMPARISON:  Radiographs 01/27/2021 and 07/06/2019.  CT 01/27/2021. FINDINGS: 1439 hours. The heart size and mediastinal contours are normal. The lungs are clear. There is no pleural effusion or pneumothorax. No acute osseous findings are identified. Telemetry leads overlie the chest. IMPRESSION: Stable chest.  No evidence of active cardiopulmonary process.  Electronically Signed   By: Carey Bullocks M.D.   On: 10/31/2021 14:47    Procedures Procedures    Medications Ordered in ED Medications  sodium chloride 0.9 % bolus 1,000 mL (0 mLs Intravenous Stopped 10/31/21 1633)  potassium chloride SA (KLOR-CON M) CR tablet 40 mEq (40 mEq Oral Given 10/31/21 1632)    ED Course/ Medical Decision Making/ A&P    41 year old here for evaluation of intermittent palpitations and tachycardia which been occurring over the last month.  Typically occur with orthostasis, going from laying down to sitting and sitting to standing.  Some chest tightness when symptoms occur  and lightheadedness.  Symptoms improves when she lays down.  No lower extremity edema or pain.  No clinical evidence of VTE.   Labs and imaging personally viewed and interpreted: BC without leukocytosis Metabolic panel mild hypokalemia at 3.3, given replacement BNP 4 D-dimer 0.42 Pregnancy test negative TSH 1.1 Mag 2.0 X-ray that cardiomegaly, pulm edema, pneumothorax, infiltrates EKG without ischemic changes  Patient reassessed.  I discussed her labs and imaging.  Tachycardia improved with IV fluids.  Possible dehydration versus postural orthostatic tachycardia.  I discussed increase fluids at home as well as salt.  Limit any caffeine, tobacco use.  Does have follow-up appointment in 5 days with PCP.  We will have her keep this appointment.  She will return for any new or worsening symptoms  The patient has been appropriately medically screened and/or stabilized in the ED. I have low suspicion for any other emergent medical condition which would require further screening, evaluation or treatment in the ED or require inpatient management.  Patient is hemodynamically stable and in no acute distress.  Patient able to ambulate in department prior to ED.  Evaluation does not show acute pathology that would require ongoing or additional emergent interventions while in the emergency department or  further inpatient treatment.  I have discussed the diagnosis with the patient and answered all questions.  Pain is been managed while in the emergency department and patient has no further complaints prior to discharge.  Patient is comfortable with plan discussed in room and is stable for discharge at this time.  I have discussed strict return precautions for returning to the emergency department.  Patient was encouraged to follow-up with PCP/specialist refer to at discharge.                           Medical Decision Making Amount and/or Complexity of Data Reviewed External Data Reviewed: labs, radiology, ECG and notes. Labs: ordered. Decision-making details documented in ED Course. Radiology: ordered and independent interpretation performed. Decision-making details documented in ED Course. ECG/medicine tests: ordered and independent interpretation performed. Decision-making details documented in ED Course.  Risk OTC drugs. Prescription drug management. Parenteral controlled substances. Decision regarding hospitalization. Diagnosis or treatment significantly limited by social determinants of health.          Final Clinical Impression(s) / ED Diagnoses Final diagnoses:  Tachycardia, paroxysmal ([place])  Hypokalemia    Rx / DC Orders ED Discharge Orders     None         Elaine Roanhorse A, PA-C 10/31/21 1743    Kommor, Debe Coder, MD 10/31/21 2148

## 2021-10-31 NOTE — Discharge Instructions (Signed)
Make sure keep close follow-up with your care provider.  Increase your salt intake as well as your fluids  Return for new or worsening symptoms

## 2021-11-14 ENCOUNTER — Ambulatory Visit: Payer: PRIVATE HEALTH INSURANCE | Attending: Internal Medicine

## 2021-11-14 ENCOUNTER — Ambulatory Visit: Payer: PRIVATE HEALTH INSURANCE | Attending: Internal Medicine | Admitting: Internal Medicine

## 2021-11-14 ENCOUNTER — Encounter: Payer: Self-pay | Admitting: Internal Medicine

## 2021-11-14 ENCOUNTER — Other Ambulatory Visit: Payer: Self-pay | Admitting: Internal Medicine

## 2021-11-14 DIAGNOSIS — R002 Palpitations: Secondary | ICD-10-CM | POA: Diagnosis not present

## 2021-11-14 NOTE — Progress Notes (Signed)
Cardiology Office Note  Date: 11/14/2021   ID: Samantha Fox, DOB 06/21/1980, MRN [id number]  PCP:  Rory Percy, MD  Cardiologist:  Carlyle Dolly, MD Electrophysiologist:  None   Reason for Office Visit: Evaluation of palpitations at the request of Dr. Nadara Mustard.   History of Present Illness: Samantha Fox is a 41 y.o. female known to have anxiety/depression presented to the cardiology clinic for evaluation of palpitations at the request of Dr. Nadara Mustard.  Patient was last seen by Dr. Harl Bowie in 2019 for evaluation of palpitations.  24-hour Holter monitor showed sinus rhythm and PVCs. Due to debilitating symptoms, patient was started on Lopressor 37.5 mg twice daily.  Due to improvement in symptoms, patient stopped taking that medication a long time ago.  However now, she started to have new symptoms of palpitations present throughout the day associated with dizziness/lightheadedness and presyncope. She was not able to work out or even perform body stretching due to the above symptoms.  Does not take caffeine, energy drinks, herbal supplements. No family history of similar issues. Denies smoking cigarettes, illicit drug use, alcohol use. She stated she has heavy menstrual cycles, her hemoglobin is stable.  Past Medical History:  Diagnosis Date   Acid reflux    Anxiety    High cholesterol    Migraines    Tendonitis     Past Surgical History:  Procedure Laterality Date   CHOLECYSTECTOMY     TUBAL LIGATION      Current Outpatient Medications  Medication Sig Dispense Refill   albuterol (VENTOLIN HFA) 108 (90 Base) MCG/ACT inhaler Inhale 2 puffs into the lungs every 4 (four) hours as needed for wheezing or shortness of breath (or cough or chest tightness). 1 each 0   cetirizine (ZYRTEC) 10 MG tablet Take 10 mg by mouth daily.     DULoxetine (CYMBALTA) 60 MG capsule Take 60 mg by mouth daily.     LINZESS 145 MCG CAPS capsule Take 145 mcg by mouth daily.     montelukast  (SINGULAIR) 10 MG tablet TAKE 1 TABLET BY MOUTH AT BEDTIME 30 tablet 1   pantoprazole (PROTONIX) 40 MG tablet Take 40 mg by mouth daily.  0   Semaglutide, 2 MG/DOSE, (OZEMPIC, 2 MG/DOSE,) 8 MG/3ML SOPN      No current facility-administered medications for this visit.   Allergies:  Sulfa antibiotics and Amoxicillin-pot clavulanate   Social History: The patient  reports that she has never smoked. She has never used smokeless tobacco. She reports that she does not drink alcohol and does not use drugs.   Family History: The patient's family history is not on file.   ROS:  Please see the history of present illness. Otherwise, complete review of systems is positive for none.  All other systems are reviewed and negative.   Physical Exam: VS:  BP 122/80   Pulse 96   Ht 5\' 3"  (1.6 m)   Wt 136 lb (61.7 kg)   LMP 10/12/2021   SpO2 99%   BMI 24.09 kg/m , BMI Body mass index is 24.09 kg/m.  Wt Readings from Last 3 Encounters:  11/14/21 136 lb (61.7 kg)  01/27/21 198 lb (89.8 kg)  06/28/20 195 lb (88.5 kg)    General: Patient appears comfortable at rest. HEENT: Conjunctiva and lids normal, oropharynx clear with moist mucosa. Neck: Supple, no elevated JVP or carotid bruits, no thyromegaly. Lungs: Clear to auscultation, nonlabored breathing at rest. Cardiac: Regular rate and rhythm, no S3 or significant systolic  murmur, no pericardial rub. Abdomen: Soft, nontender, no hepatomegaly, bowel sounds present, no guarding or rebound. Extremities: No pitting edema, distal pulses 2+. Skin: Warm and dry. Musculoskeletal: No kyphosis. Neuropsychiatric: Alert and oriented x3, affect grossly appropriate.  ECG:  An ECG dated 10/31/21 was personally reviewed today and demonstrated:  Sinus tachycardia  Recent Labwork: 10/31/2021: B Natriuretic Peptide 4.0; BUN 13; Creatinine, Ser 0.71; Hemoglobin 14.4; Magnesium 2.0; Platelets 449; Potassium 3.3; Sodium 136; TSH 1.112  No results found for: "CHOL",  "TRIG", "HDL", "CHOLHDL", "VLDL", "LDLCALC", "LDLDIRECT"  Other Studies Reviewed Today: Holter monitor in 2019 24 hr holter monitor Min HR 59, Max HR 159, Avg HR 93 No supreventricular ectopy Occasisional ventricular ectopy, 6% burden. Primarily in the the form of singlets, couplts, bigeminy. No diary submitted No significant arrhythmias  Assessment and Plan: Patient is a 41 year old F with no significant PMH was referred to cardiology clinic for evaluation of palpitations the request of Dr. Nadara Mustard.  #Palpitations Plan -Orthostatic vitals are negative for orthostatic hypotension and POTS. Patient has debilitating symptoms of palpitations associated with dizziness/lightheadedness and presyncope. Obtain 1 week event monitor to rule out inappropriate sinus tachycardia. In the meantime, patient is educated to drink 3 L of water every day, include 2 teaspoons of salt in her diet every day, and have compression garments, thigh-high to abdomen.  If patient has IST, will start rate controlling therapy in addition to the above measures. -TSH is normal.  Will obtain iron panel and morning cortisol.  I have spent a total of 33 minutes with patient reviewing chart , telemetry, EKGs, labs and examining patient as well as establishing an assessment and plan that was discussed with the patient.  > 50% of time was spent in direct patient care.     Medication Adjustments/Labs and Tests Ordered: Current medicines are reviewed at length with the patient today.  Concerns regarding medicines are outlined above.   Tests Ordered: No orders of the defined types were placed in this encounter.   Medication Changes: No orders of the defined types were placed in this encounter.   Disposition:  Follow up  3 weeks  Signed, Haroun Cotham Fidel Levy, MD, 11/14/2021 8:07 AM    [facility] at [facility] [address] [place],  [address]

## 2021-11-14 NOTE — Patient Instructions (Addendum)
Medication Instructions:  Your physician recommends that you continue on your current medications as directed. Please refer to the Current Medication list given to you today.  *If you need a refill on your cardiac medications before your next appointment, please call your pharmacy*   Lab Work: Your physician recommends that you return for lab work in: Tomorrow AM   If you have labs (blood work) drawn today and your tests are completely normal, you will receive your results only by: [place] (if you have MyChart) OR A paper copy in the mail If you have any lab test that is abnormal or we need to change your treatment, we will call you to review the results.   Testing/Procedures: Zio Monitor    Follow-Up: At [facility], you and your health needs are our priority.  As part of our continuing mission to provide you with exceptional heart care, we have created designated Provider Care Teams.  These Care Teams include your primary Cardiologist (physician) and Advanced Practice Providers (APPs -  Physician Assistants and Nurse Practitioners) who all work together to provide you with the care you need, when you need it.  We recommend signing up for the patient portal called "MyChart".  Sign up information is provided on this After Visit Summary.  MyChart is used to connect with patients for Virtual Visits (Telemedicine).  Patients are able to view lab/test results, encounter notes, upcoming appointments, etc.  Non-urgent messages can be sent to your provider as well.   To learn more about what you can do with MyChart, go to [email or web address].    Your next appointment:   3 week(s)  The format for your next appointment:   In Person  Provider:   Dr. Dellia Cloud      Other Instructions Thank you for choosing [facility]!    Important Information About Sugar        ZIO XT- Long Term Monitor Instructions   Your physician has requested you wear your  ZIO patch monitor___7____days.   This is a single patch monitor.  [employer] supplies one patch monitor per enrollment.  Additional stickers are not available.   Please do not apply patch if you will be having a Nuclear Stress Test, Echocardiogram, Cardiac CT, MRI, or Chest Xray during the time frame you would be wearing the monitor. The patch cannot be worn during these tests.  You cannot remove and re-apply the ZIO XT patch monitor.   Your ZIO patch monitor will be sent USPS Priority mail from [facility] directly to your home address. The monitor may also be mailed to a PO BOX if home delivery is not available.   It may take 3-5 days to receive your monitor after you have been enrolled.   Once you have received you monitor, please review enclosed instructions.  Your monitor has already been registered assigning a specific monitor serial # to you.   Applying the monitor   Shave hair from upper left chest.   Hold abrader disc by orange tab.  Rub abrader in 40 strokes over left upper chest as indicated in your monitor instructions.   Clean area with 4 enclosed alcohol pads .  Use all pads to assure are is cleaned thoroughly.  Let dry.   Apply patch as indicated in monitor instructions.  Patch will be place under collarbone on left side of chest with arrow pointing upward.   Rub patch adhesive wings for 2 minutes.Remove white label marked "1".  Remove white  label marked "2".  Rub patch adhesive wings for 2 additional minutes.   While looking in a mirror, press and release button in center of patch.  A small green light will flash 3-4 times .  This will be your only indicator the monitor has been turned on.     Do not shower for the first 24 hours.  You may shower after the first 24 hours.   Press button if you feel a symptom. You will hear a small click.  Record Date, Time and Symptom in the Patient Log Book.   When you are ready to remove patch, follow instructions on last 2 pages  of Patient Log Book.  Stick patch monitor onto last page of Patient Log Book.   Place Patient Log Book in [place] box.  Use locking tab on box and tape box closed securely.  The [employer] and [employer] has [employer] on it.  Please place in mailbox as soon as possible.  Your physician should have your test results approximately 7 days after the monitor has been mailed back to [facility].   Call [facility] at [phone] if you have questions regarding your ZIO XT patch monitor.  Call them immediately if you see an orange light blinking on your monitor.   If your monitor falls off in less than 4 days contact our Monitor department at [phone].  If your monitor becomes loose or falls off after 4 days call [employer] at [phone] for suggestions on securing your monitor.

## 2021-11-15 ENCOUNTER — Other Ambulatory Visit (HOSPITAL_COMMUNITY)
Admission: RE | Admit: 2021-11-15 | Discharge: 2021-11-15 | Disposition: A | Discharge status: Home / Self Care | Payer: PRIVATE HEALTH INSURANCE | Source: Ambulatory Visit | Attending: Internal Medicine | Admitting: Internal Medicine

## 2021-11-15 DIAGNOSIS — R002 Palpitations: Secondary | ICD-10-CM | POA: Insufficient documentation

## 2021-11-15 LAB — IRON AND TIBC
Iron: 100 ug/dL (ref 28–170)
Saturation Ratios: 36 % — ABNORMAL HIGH (ref 10.4–31.8)
TIBC: 280 ug/dL (ref 250–450)
UIBC: 180 ug/dL

## 2021-11-15 LAB — CORTISOL-AM, BLOOD: Cortisol - AM: 8.2 ug/dL (ref 6.7–22.6)

## 2021-12-03 ENCOUNTER — Ambulatory Visit: Payer: PRIVATE HEALTH INSURANCE | Admitting: Internal Medicine

## 2021-12-20 ENCOUNTER — Telehealth: Payer: Self-pay | Admitting: *Deleted

## 2021-12-20 MED ORDER — IVABRADINE HCL 5 MG PO TABS: 5.0000 mg | ORAL_TABLET | Freq: Two times a day (BID) | ORAL | 11 refills | Status: DC

## 2021-12-20 NOTE — Telephone Encounter (Signed)
Pt notified of test results. And orders placed.

## 2021-12-20 NOTE — Telephone Encounter (Signed)
-----   Message from Marjo Bicker, MD sent at 12/20/2021 12:21 PM EST ----- Patient meets criteria for inappropriate sinus tachycardia based on event monitor report and labs. Start PO Ivabradine 5mg  twice daily. Reschedule the cardiology appointment from 12/28/21 to 01/09/22.

## 2021-12-24 ENCOUNTER — Encounter: Payer: Self-pay | Admitting: Internal Medicine

## 2021-12-25 ENCOUNTER — Other Ambulatory Visit: Payer: Self-pay

## 2021-12-25 MED ORDER — METOPROLOL TARTRATE 25 MG PO TABS: 25.0000 mg | ORAL_TABLET | Freq: Two times a day (BID) | ORAL | 3 refills | Status: DC

## 2021-12-28 ENCOUNTER — Ambulatory Visit: Payer: PRIVATE HEALTH INSURANCE | Admitting: Internal Medicine

## 2022-01-09 ENCOUNTER — Ambulatory Visit: Payer: PRIVATE HEALTH INSURANCE | Admitting: Internal Medicine

## 2022-01-24 ENCOUNTER — Ambulatory Visit: Payer: PRIVATE HEALTH INSURANCE | Admitting: Internal Medicine

## 2022-01-24 ENCOUNTER — Encounter (INDEPENDENT_AMBULATORY_CARE_PROVIDER_SITE_OTHER): Payer: PRIVATE HEALTH INSURANCE | Admitting: Internal Medicine

## 2022-01-25 NOTE — Progress Notes (Signed)
Erroneous encounter - please disregard.

## 2022-02-01 ENCOUNTER — Ambulatory Visit: Payer: PRIVATE HEALTH INSURANCE | Attending: Internal Medicine | Admitting: Internal Medicine

## 2022-02-01 ENCOUNTER — Encounter: Payer: Self-pay | Admitting: Internal Medicine

## 2022-02-01 VITALS — BP 100/60 | HR 82 | Ht 63.0 in | Wt 129.8 lb

## 2022-02-01 DIAGNOSIS — I4711 Inappropriate sinus tachycardia, so stated: Secondary | ICD-10-CM

## 2022-02-01 NOTE — Patient Instructions (Signed)
Medication Instructions:  Your physician recommends that you continue on your current medications as directed. Please refer to the Current Medication list given to you today.  *If you need a refill on your cardiac medications before your next appointment, please call your pharmacy*   Lab Work: NONE  If you have labs (blood work) drawn today and your tests are completely normal, you will receive your results only by: MyChart Message (if you have MyChart) OR A paper copy in the mail If you have any lab test that is abnormal or we need to change your treatment, we will call you to review the results.   Testing/Procedures: NONE    Follow-Up: At [facility], you and your health needs are our priority.  As part of our continuing mission to provide you with exceptional heart care, we have created designated Provider Care Teams.  These Care Teams include your primary Cardiologist (physician) and Advanced Practice Providers (APPs -  Physician Assistants and Nurse Practitioners) who all work together to provide you with the care you need, when you need it.  We recommend signing up for the patient portal called "MyChart".  Sign up information is provided on this After Visit Summary.  MyChart is used to connect with patients for Virtual Visits (Telemedicine).  Patients are able to view lab/test results, encounter notes, upcoming appointments, etc.  Non-urgent messages can be sent to your provider as well.   To learn more about what you can do with MyChart, go to [email or web address].    Your next appointment:   1 year(s)  Provider:   Vishnu Mallipeddi, MD    Other Instructions Thank you for choosing [facility]!    

## 2022-02-03 DIAGNOSIS — I4711 Inappropriate sinus tachycardia, so stated: Secondary | ICD-10-CM | POA: Insufficient documentation

## 2022-02-03 NOTE — Progress Notes (Signed)
Cardiology Office Note  Date: 02/03/2022   ID: Samantha Fox, DOB [birth year], MRN [id number]  PCP:  Rory Percy, MD  Cardiologist:  Chalmers Guest, MD Electrophysiologist:  None   Reason for Office Visit: Follow-up of palpitations   History of Present Illness: Samantha Fox is a 42 y.o. female known to have anxiety/depression presented to the cardiology clinic for follow-up of palpitations.  Patient was initially referred to cardiology clinic for evaluation of palpitations in 2019.  She had 24-hour Holter monitor which showed sinus rhythm and PVCs with average heart rate 93 bpm. She was started on Lopressor 37.5 mg twice daily with improvement in symptoms. Since there are improvement in symptoms, she stopped taking the medication a long time ago.  Now again, she was referred back to cardiology clinic for evaluation of palpitations that are present throughout the day associated with dizziness/lightheadedness and presyncope. She was not able to work out or even perform daily activities due to the above symptoms. She does not take coffee, energy drinks, herbal supplements.  No family history of similar issues. She does have heavy menstrual cycles, her hemoglobin was stable. She underwent event monitor in 11/23 which showed average heart rate of 92 bpm, symptoms correlated with high 90s and sinus tachycardia most of the time. She was started on metoprolol tartrate 25 mg twice daily with significant improvement in symptoms.  Past Medical History:  Diagnosis Date   Acid reflux    Anxiety    High cholesterol    Migraines    Tendonitis     Past Surgical History:  Procedure Laterality Date   CHOLECYSTECTOMY     TUBAL LIGATION      Current Outpatient Medications  Medication Sig Dispense Refill   albuterol (VENTOLIN HFA) 108 (90 Base) MCG/ACT inhaler Inhale 2 puffs into the lungs every 4 (four) hours as needed for wheezing or shortness of breath (or cough or chest tightness). 1  each 0   cetirizine (ZYRTEC) 10 MG tablet Take 10 mg by mouth daily.     DULoxetine (CYMBALTA) 60 MG capsule Take 60 mg by mouth daily.     LINZESS 145 MCG CAPS capsule Take 145 mcg by mouth daily.     metoprolol tartrate (LOPRESSOR) 25 MG tablet Take 1 tablet (25 mg total) by mouth 2 (two) times daily. 180 tablet 3   montelukast (SINGULAIR) 10 MG tablet TAKE 1 TABLET BY MOUTH AT BEDTIME 30 tablet 1   pantoprazole (PROTONIX) 40 MG tablet Take 40 mg by mouth daily.  0   Semaglutide, 2 MG/DOSE, (OZEMPIC, 2 MG/DOSE,) 8 MG/3ML SOPN  (Patient not taking: Reported on 02/01/2022)     No current facility-administered medications for this visit.   Allergies:  Sulfa antibiotics and Amoxicillin-pot clavulanate   Social History: The patient  reports that she has never smoked. She has never used smokeless tobacco. She reports that she does not drink alcohol and does not use drugs.   Family History: The patient's family history is not on file.   ROS:  Please see the history of present illness. Otherwise, complete review of systems is positive for none.  All other systems are reviewed and negative.   Physical Exam: VS:  BP 100/60   Pulse 82   Ht 5\' 3"  (1.6 m)   Wt 129 lb 12.8 oz (58.9 kg)   SpO2 100%   BMI 22.99 kg/m , BMI Body mass index is 22.99 kg/m.  Wt Readings from Last 3 Encounters:  02/01/22  129 lb 12.8 oz (58.9 kg)  11/14/21 136 lb (61.7 kg)  01/27/21 198 lb (89.8 kg)    General: Patient appears comfortable at rest. HEENT: Conjunctiva and lids normal, oropharynx clear with moist mucosa. Neck: Supple, no elevated JVP or carotid bruits, no thyromegaly. Lungs: Clear to auscultation, nonlabored breathing at rest. Cardiac: Regular rate and rhythm, no S3 or significant systolic murmur, no pericardial rub. Abdomen: Soft, nontender, no hepatomegaly, bowel sounds present, no guarding or rebound. Extremities: No pitting edema, distal pulses 2+. Skin: Warm and dry. Musculoskeletal: No  kyphosis. Neuropsychiatric: Alert and oriented x3, affect grossly appropriate.  ECG:  An ECG dated 10/31/21 was personally reviewed today and demonstrated:  Sinus tachycardia  Recent Labwork: 10/31/2021: B Natriuretic Peptide 4.0; BUN 13; Creatinine, Ser 0.71; Hemoglobin 14.4; Magnesium 2.0; Platelets 449; Potassium 3.3; Sodium 136; TSH 1.112  No results found for: "CHOL", "TRIG", "HDL", "CHOLHDL", "VLDL", "LDLCALC", "LDLDIRECT"  Other Studies Reviewed Today: Event monitor in 11/2021 Normal sinus rhythm with average HR 92 bpm.  Patient events correlated with sinus rhythm high 90s and sinus tachycardia most of the time.  No arrhythmias.   Holter monitor in 2019 24 hr holter monitor Min HR 59, Max HR 159, Avg HR 93 No supreventricular ectopy Occasisional ventricular ectopy, 6% burden. Primarily in the the form of singlets, couplts, bigeminy. No diary submitted No significant arrhythmias    Assessment and Plan: Patient is a 42 year old F with no significant PMH presented to cardiology clinic for follow-up visit.  # Inappropriate sinus tachycardia, possibly -Orthostatic vitals from 11/23 were negative for orthostatic hypotension and POTS.  Patient had event monitor 11/2021 which showed average heart rate 92 bpm and symptoms correlating with high 90s and sinus tachycardia. All the labs including CBC, BMP, TSH, iron panel, cortisol were within normal limits. She did meet criteria for inappropriate sinus tachycardia and metoprolol tartrate 25 mg twice daily was started with significant improvement in symptoms. Continue metoprolol tartrate 25 mg twice daily.  I have spent a total of 20 minutes with patient reviewing chart , telemetry, EKGs, labs and examining patient as well as establishing an assessment and plan that was discussed with the patient.  > 50% of time was spent in direct patient care.     Medication Adjustments/Labs and Tests Ordered: Current medicines are reviewed at length  with the patient today.  Concerns regarding medicines are outlined above.   Tests Ordered: No orders of the defined types were placed in this encounter.   Medication Changes: No orders of the defined types were placed in this encounter.   Disposition:  Follow up  one year  Signed, Destine Ambroise Fidel Levy, MD, 02/03/2022 5:34 AM    [place] [facility] [address], [place], [place] [address]

## 2022-04-03 ENCOUNTER — Encounter: Payer: Self-pay | Admitting: Internal Medicine

## 2022-04-04 ENCOUNTER — Other Ambulatory Visit: Payer: Self-pay

## 2022-04-04 MED ORDER — METOPROLOL TARTRATE 25 MG PO TABS: 25.0000 mg | ORAL_TABLET | Freq: Two times a day (BID) | ORAL | 3 refills | Status: DC

## 2022-04-04 NOTE — Telephone Encounter (Signed)
Samantha Fox  P [facility] Triage (supporting Vishnu P Mallipeddi, MD)11 hours ago (7:39 PM)    Could you send my prescription for metoprolol tartrate 25 MG tablet to [facility] in [place] please?      Sent to [employer]

## 2022-08-03 ENCOUNTER — Other Ambulatory Visit: Payer: Self-pay

## 2022-08-03 ENCOUNTER — Emergency Department (HOSPITAL_COMMUNITY)
Admission: EM | Admit: 2022-08-03 | Discharge: 2022-08-03 | Disposition: A | Discharge status: Home / Self Care | Payer: BC Managed Care – PPO | Source: Home / Self Care | Attending: Emergency Medicine | Admitting: Emergency Medicine

## 2022-08-03 ENCOUNTER — Emergency Department (HOSPITAL_COMMUNITY): Payer: BC Managed Care – PPO

## 2022-08-03 ENCOUNTER — Encounter (HOSPITAL_COMMUNITY): Payer: Self-pay

## 2022-08-03 DIAGNOSIS — R1011 Right upper quadrant pain: Secondary | ICD-10-CM | POA: Diagnosis not present

## 2022-08-03 DIAGNOSIS — R110 Nausea: Secondary | ICD-10-CM | POA: Diagnosis not present

## 2022-08-03 DIAGNOSIS — R1010 Upper abdominal pain, unspecified: Secondary | ICD-10-CM

## 2022-08-03 DIAGNOSIS — R008 Other abnormalities of heart beat: Secondary | ICD-10-CM | POA: Insufficient documentation

## 2022-08-03 DIAGNOSIS — R109 Unspecified abdominal pain: Secondary | ICD-10-CM | POA: Diagnosis present

## 2022-08-03 DIAGNOSIS — I498 Other specified cardiac arrhythmias: Secondary | ICD-10-CM

## 2022-08-03 LAB — COMPREHENSIVE METABOLIC PANEL
ALT: 13 U/L (ref 0–44)
AST: 20 U/L (ref 15–41)
Albumin: 4.3 g/dL (ref 3.5–5.0)
Alkaline Phosphatase: 72 U/L (ref 38–126)
Anion gap: 9 (ref 5–15)
BUN: 11 mg/dL (ref 6–20)
CO2: 24 mmol/L (ref 22–32)
Calcium: 8.9 mg/dL (ref 8.9–10.3)
Chloride: 101 mmol/L (ref 98–111)
Creatinine, Ser: 0.77 mg/dL (ref 0.44–1.00)
GFR, Estimated: >60 mL/min (ref >60)
Glucose, Bld: 93 mg/dL (ref 70–99)
Potassium: 3.5 mmol/L (ref 3.5–5.1)
Sodium: 134 mmol/L — ABNORMAL LOW (ref 135–145)
Total Bilirubin: 0.8 mg/dL (ref 0.3–1.2)
Total Protein: 7.6 g/dL (ref 6.5–8.1)

## 2022-08-03 LAB — URINALYSIS, ROUTINE W REFLEX MICROSCOPIC
Bilirubin Urine: NEGATIVE
Glucose, UA: NEGATIVE mg/dL
Hgb urine dipstick: NEGATIVE
Ketones, ur: NEGATIVE mg/dL
Leukocytes,Ua: NEGATIVE
Nitrite: NEGATIVE
Protein, ur: NEGATIVE mg/dL
Specific Gravity, Urine: 1.003 — ABNORMAL LOW (ref 1.005–1.030)
pH: 6 (ref 5.0–8.0)

## 2022-08-03 LAB — CBC
HCT: 39.5 % (ref 36.0–46.0)
Hemoglobin: 12.8 g/dL (ref 12.0–15.0)
MCH: 29.9 pg (ref 26.0–34.0)
MCHC: 32.4 g/dL (ref 30.0–36.0)
MCV: 92.3 fL (ref 80.0–100.0)
Platelets: 464 10*3/uL — ABNORMAL HIGH (ref 150–400)
RBC: 4.28 MIL/uL (ref 3.87–5.11)
RDW: 14.6 % (ref 11.5–15.5)
WBC: 13.4 10*3/uL — ABNORMAL HIGH (ref 4.0–10.5)
nRBC: 0 % (ref 0.0–0.2)

## 2022-08-03 LAB — POC URINE PREG, ED: Preg Test, Ur: NEGATIVE

## 2022-08-03 LAB — TROPONIN I (HIGH SENSITIVITY): Troponin I (High Sensitivity): 3 ng/L (ref <18)

## 2022-08-03 LAB — LIPASE, BLOOD: Lipase: 31 U/L (ref 11–51)

## 2022-08-03 LAB — MAGNESIUM: Magnesium: 1.9 mg/dL (ref 1.7–2.4)

## 2022-08-03 MED ORDER — METOPROLOL TARTRATE 25 MG PO TABS: 25.0000 mg | ORAL_TABLET | Freq: Once | ORAL | Status: DC

## 2022-08-03 MED ORDER — METOPROLOL TARTRATE 5 MG/5ML IV SOLN
5.0000 mg | Freq: Once | INTRAVENOUS | Status: AC
  Administered 2022-08-03: 5 mg via INTRAVENOUS
  Filled 2022-08-03: qty 5

## 2022-08-03 MED ORDER — IOHEXOL 300 MG/ML  SOLN
100.0000 mL | Freq: Once | INTRAMUSCULAR | Status: AC | PRN
  Administered 2022-08-03: 100 mL via INTRAVENOUS

## 2022-08-03 MED ORDER — MORPHINE SULFATE (PF) 4 MG/ML IV SOLN
4.0000 mg | Freq: Once | INTRAVENOUS | Status: AC
  Administered 2022-08-03: 4 mg via INTRAVENOUS
  Filled 2022-08-03: qty 1

## 2022-08-03 MED ORDER — ONDANSETRON HCL 4 MG/2ML IJ SOLN
4.0000 mg | Freq: Once | INTRAMUSCULAR | Status: AC
  Administered 2022-08-03: 4 mg via INTRAVENOUS
  Filled 2022-08-03: qty 2

## 2022-08-03 MED ORDER — METOPROLOL TARTRATE 25 MG PO TABS: 37.5000 mg | ORAL_TABLET | Freq: Two times a day (BID) | ORAL | 3 refills | Status: DC

## 2022-08-03 NOTE — Discharge Instructions (Addendum)
Evaluation today was overall reassuring.  Abdominal pain.  Encounter did reveal that you are consistently on ventricular bigeminy.  Spoke to Dr. Jenene Slicker who advised to increase your metoprolol dose daily.  Sent that to your pharmacy.  If you have worsening chest pain, worsening palpitations, worsening abdominal pain or any other concerning symptom please return emergency department further evaluation.

## 2022-08-03 NOTE — ED Provider Notes (Addendum)
[facility] AT [facility] Provider Note   CSN: [id number] Arrival date & time: 08/03/22  1100     History  Chief Complaint  Patient presents with   Abdominal Pain   HPI Samantha Fox is a 42 y.o. female with hyperlipidemia, status post cholecystectomy presenting for abdominal pain.  Started this morning.  Located in the right upper quadrant radiates to the epigastric region and to the back.  Endorses nausea but no vomiting or diarrhea.  Denies urinary changes.  Also notes that she has felt some irregularity in her heart rhythm this morning as well.  States she has had a history of PVCs in the past.  Denies chest pain ans shortness of at this time.   Abdominal Pain      Home Medications Prior to Admission medications   Medication Sig Start Date End Date Taking? Authorizing Provider  albuterol (VENTOLIN HFA) 108 (90 Base) MCG/ACT inhaler Inhale 2 puffs into the lungs every 4 (four) hours as needed for wheezing or shortness of breath (or cough or chest tightness). 01/27/21   Dione Booze, MD  cetirizine (ZYRTEC) 10 MG tablet Take 10 mg by mouth daily. 07/01/19   [provider]  DULoxetine (CYMBALTA) 60 MG capsule Take 60 mg by mouth daily. 08/22/21   [provider]  LINZESS 145 MCG CAPS capsule Take 145 mcg by mouth daily. 08/22/21   [provider]  metoprolol tartrate (LOPRESSOR) 25 MG tablet Take 1.5 tablets (37.5 mg total) by mouth 2 (two) times daily. 08/03/22 07/29/23  Gareth Eagle, PA-C  montelukast (SINGULAIR) 10 MG tablet TAKE 1 TABLET BY MOUTH AT BEDTIME 06/12/20   Lorin Glass, MD  pantoprazole (PROTONIX) 40 MG tablet Take 40 mg by mouth daily. 06/12/17   [provider]  Semaglutide, 2 MG/DOSE, (OZEMPIC, 2 MG/DOSE,) 8 MG/3ML SOPN  09/17/21   [provider]      Allergies    Sulfa antibiotics and Amoxicillin-pot clavulanate    Review of Systems   Review of Systems  Gastrointestinal:  Positive  for abdominal pain.    Physical Exam   Vitals:   08/03/22 1430 08/03/22 1445  BP: 132/64 (!) 145/76  Pulse: (!) 42 (!) 42  Resp: 14 17  Temp:    SpO2: 100% 100%    CONSTITUTIONAL:  well-appearing, NAD NEURO:  Alert and oriented x 3, CN 3-12 grossly intact EYES:  eyes equal and reactive ENT/NECK:  Supple, no stridor  CARDIO:  regular rate and rhythm, appears well-perfused  PULM:  No respiratory distress, CTAB GI/GU:  non-distended, soft, RUQ and epigastric tenderness MSK/SPINE:  No gross deformities, no edema, moves all extremities  SKIN:  no rash, atraumatic   *Additional and/or pertinent findings included in MDM below    ED Results / Procedures / Treatments   Labs (all labs ordered are listed, but only abnormal results are displayed) Labs Reviewed  COMPREHENSIVE METABOLIC PANEL - Abnormal; Notable for the following components:      Result Value   Sodium 134 (*)    All other components within normal limits  CBC - Abnormal; Notable for the following components:   WBC 13.4 (*)    Platelets 464 (*)    All other components within normal limits  URINALYSIS, ROUTINE W REFLEX MICROSCOPIC - Abnormal; Notable for the following components:   Color, Urine STRAW (*)    Specific Gravity, Urine 1.003 (*)    All other components within normal limits  LIPASE,  BLOOD  MAGNESIUM  POC URINE PREG, ED  TROPONIN I (HIGH SENSITIVITY)    EKG None  Radiology DG Chest Portable 1 View  Result Date: 08/03/2022 CLINICAL DATA:  Palpitations, shortness of breath EXAM: PORTABLE CHEST 1 VIEW COMPARISON:  10/31/2021 FINDINGS: Transverse diameter of heart is slightly increased. There are no signs of pulmonary edema or focal pulmonary consolidation. There is no pleural effusion or pneumothorax. IMPRESSION: No active cardiopulmonary disease. Electronically Signed   By: Ernie Avena M.D.   On: 08/03/2022 14:32   CT ABDOMEN PELVIS W CONTRAST  Result Date: 08/03/2022 CLINICAL DATA:  Right  upper quadrant abdominal pain radiating to the flank since this morning. EXAM: CT ABDOMEN AND PELVIS WITH CONTRAST TECHNIQUE: Multidetector CT imaging of the abdomen and pelvis was performed using the standard protocol following bolus administration of intravenous contrast. RADIATION DOSE REDUCTION: This exam was performed according to the departmental dose-optimization program which includes automated exposure control, adjustment of the mA and/or kV according to patient size and/or use of iterative reconstruction technique. CONTRAST:  OMNIPAQUE IOHEXOL 300 MG/ML  SOLN COMPARISON:  CT abdomen pelvis dated October 15, 2017. FINDINGS: Lower chest: No acute abnormality. Hepatobiliary: No focal liver abnormality. Unchanged mild central intrahepatic biliary dilatation status post cholecystectomy. Pancreas: Unremarkable. No pancreatic ductal dilatation or surrounding inflammatory changes. Spleen: Normal in size without focal abnormality. Adrenals/Urinary Tract: Adrenal glands are unremarkable. Kidneys are normal, without renal calculi, focal lesion, or hydronephrosis. Bladder is unremarkable. Stomach/Bowel: Stomach is within normal limits. Appendix appears normal. No evidence of bowel wall thickening, distention, or inflammatory changes. Mobile cecum, currently located in the right upper quadrant. Vascular/Lymphatic: No significant vascular findings are present. No enlarged abdominal or pelvic lymph nodes. Reproductive: Uterus and bilateral adnexa are unremarkable. Other: Trace free fluid in the pelvis, likely physiologic. No pneumoperitoneum. Musculoskeletal: No acute or significant osseous findings. IMPRESSION: 1. No acute intra-abdominal process. Electronically Signed   By: Obie Dredge M.D.   On: 08/03/2022 12:51    Procedures Procedures    Medications Ordered in ED Medications  morphine (PF) 4 MG/ML injection 4 mg (4 mg Intravenous Given 08/03/22 1146)  ondansetron (ZOFRAN) injection 4 mg (4 mg  Intravenous Given 08/03/22 1146)  iohexol (OMNIPAQUE) 300 MG/ML solution 100 mL (100 mLs Intravenous Contrast Given 08/03/22 1214)  metoprolol tartrate (LOPRESSOR) injection 5 mg (5 mg Intravenous Given 08/03/22 1442)    ED Course/ Medical Decision Making/ A&P Clinical Course as of 08/03/22 1512  Sat Aug 03, 2022  1345 Noted to be in ventricular bigeminy multiple times per telemetry review.  Continue to deny chest pain states she was mildly short of breath.  Treated with IV metoprolol.  She did state that she did take her Lopressor this morning.  Also ordered chest x-ray and troponin [JR]  1502 Initial troponin reassuring.  Discussed patient with Dr. Mikal Plane of cardiology [JR]    Clinical Course User Index [JR] Gareth Eagle, PA-C                             Medical Decision Making Amount and/or Complexity of Data Reviewed Labs: ordered. Radiology: ordered.  Risk Prescription drug management.   42 year old well-appearing female present for abdominal pain.  Exam notable for epigastric and right upper quadrant tenderness.  Telemetry also with frequent consistent ventricular bigeminy.  On reassessment patient did endorse some chest pressure.  This prompted further characterization with ACS evaluation.  CT abdomen/pelvis unremarkable.  Discussed  patient with Dr. Ancil Boozer who has seen her in clinic.  Advised to increase her metoprolol to 37.5 mg twice daily.  Initial troponin was 3 and patient did state that her chest tightness had improved.  Vitals remained stable throughout encounter.  Discussed pertinent precautions.  Advised to follow-up with cardiology.  Discharged home.          Final Clinical Impression(s) / ED Diagnoses Final diagnoses:  Ventricular bigeminy  Pain of upper abdomen    Rx / DC Orders ED Discharge Orders          Ordered    metoprolol tartrate (LOPRESSOR) 25 MG tablet  2 times daily        08/03/22 1459              Gareth Eagle,  PA-C 08/03/22 1515    Gareth Eagle, PA-C 08/03/22 1517    Bethann Berkshire, MD 08/05/22 1549

## 2022-08-03 NOTE — ED Triage Notes (Signed)
Per Patient  Abdomen pain Upper right Radiates to flank Ongoing since this morning

## 2022-08-03 NOTE — ED Notes (Signed)
Patient transported to CT 

## 2022-08-20 ENCOUNTER — Ambulatory Visit: Payer: BC Managed Care – PPO | Admitting: Internal Medicine

## 2022-08-21 ENCOUNTER — Ambulatory Visit: Payer: BC Managed Care – PPO | Attending: Internal Medicine | Admitting: Internal Medicine

## 2022-08-21 ENCOUNTER — Encounter: Payer: Self-pay | Admitting: Internal Medicine

## 2022-08-21 VITALS — BP 112/68 | HR 39 | Ht 63.0 in | Wt 162.2 lb

## 2022-08-21 DIAGNOSIS — I4711 Inappropriate sinus tachycardia, so stated: Secondary | ICD-10-CM | POA: Diagnosis not present

## 2022-08-21 MED ORDER — METOPROLOL TARTRATE 25 MG PO TABS: 25.0000 mg | ORAL_TABLET | Freq: Two times a day (BID) | ORAL | 3 refills | Status: AC

## 2022-08-21 NOTE — Patient Instructions (Addendum)
Medication Instructions:  Your physician has recommended you make the following change in your medication:  Decrease Metoprolol Tartrate to 25 mg twice a day Continue all taking all other medications as prescribed   Labwork: None  Testing/Procedures: None  Follow-Up: Your physician recommends that you schedule a follow-up appointment in: 1 year. You will receive a reminder call in about 8 months reminding you to schedule your appointment. If you don't receive this call, please contact our office.   Any Other Special Instructions Will Be Listed Below (If Applicable).  If you need a refill on your cardiac medications before your next appointment, please call your pharmacy.

## 2022-08-21 NOTE — Progress Notes (Signed)
Cardiology Office Note  Date: 08/21/2022   ID: Samantha Fox, DOB 1980/08/10, MRN [id number]  PCP:  Selinda Flavin, MD  Cardiologist:  Marjo Bicker, MD Electrophysiologist:  None   Reason for Office Visit: Follow-up of palpitations   History of Present Illness: Samantha Fox is a 42 y.o. female known to have anxiety/depression presented to the cardiology clinic for follow-up of palpitations.  Patient was initially referred to cardiology clinic for evaluation of palpitations in 2019.  She had 24-hour Holter monitor which showed sinus rhythm and PVCs with average heart rate 93 bpm. She was started on Lopressor 37.5 mg twice daily with improvement in symptoms. Since there are improvement in symptoms, she stopped taking the medication a long time ago.  Now again, she was referred back to cardiology clinic for evaluation of palpitations that are present throughout the day associated with dizziness/lightheadedness and presyncope. Orthostatic vitals from 11/2021 were negative for orthostatic hypotension and POTS. Event monitor from 11/23 showed average HR 92 bpm and symptoms correlating with high 90s and sinus tachycardia. Metoprolol tartrate 25 mg twice daily was started with significant improvement in symptoms but she had recent ER visit in 7/24 she was noted to have ventricular bigeminy.  Metoprolol tartarate dose was increased from 25 mg to 37.5 mg twice daily with return of palpitations (especially when HR is low).  No other symptoms of syncope, leg swelling, chest pain or DOE.  But when the heart rate drops to less than 40 bpm, she has associated symptoms of dizziness, palpitations.  Past Medical History:  Diagnosis Date   Acid reflux    Anxiety    High cholesterol    Migraines    Tendonitis     Past Surgical History:  Procedure Laterality Date   CHOLECYSTECTOMY     TUBAL LIGATION      Current Outpatient Medications  Medication Sig Dispense Refill   albuterol (VENTOLIN  HFA) 108 (90 Base) MCG/ACT inhaler Inhale 2 puffs into the lungs every 4 (four) hours as needed for wheezing or shortness of breath (or cough or chest tightness). 1 each 0   cetirizine (ZYRTEC) 10 MG tablet Take 10 mg by mouth daily.     DULoxetine (CYMBALTA) 60 MG capsule Take 60 mg by mouth daily.     LINZESS 145 MCG CAPS capsule Take 145 mcg by mouth daily.     montelukast (SINGULAIR) 10 MG tablet TAKE 1 TABLET BY MOUTH AT BEDTIME 30 tablet 1   metoprolol tartrate (LOPRESSOR) 25 MG tablet Take 1 tablet (25 mg total) by mouth 2 (two) times daily. 180 tablet 3   No current facility-administered medications for this visit.   Allergies:  Sulfa antibiotics and Amoxicillin-pot clavulanate   Social History: The patient  reports that she has never smoked. She has never used smokeless tobacco. She reports that she does not drink alcohol and does not use drugs.   Family History: The patient's family history is not on file.   ROS:  Please see the history of present illness. Otherwise, complete review of systems is positive for none.  All other systems are reviewed and negative.   Physical Exam: VS:  BP 112/68   Pulse (!) 39   Ht 5\' 3"  (1.6 m)   Wt 162 lb 3.2 oz (73.6 kg)   LMP 07/22/2022   SpO2 100%   BMI 28.73 kg/m , BMI Body mass index is 28.73 kg/m.  Wt Readings from Last 3 Encounters:  08/21/22 162 lb 3.2  oz (73.6 kg)  08/03/22 163 lb (73.9 kg)  02/01/22 129 lb 12.8 oz (58.9 kg)    General: Patient appears comfortable at rest. HEENT: Conjunctiva and lids normal, oropharynx clear with moist mucosa. Neck: Supple, no elevated JVP or carotid bruits, no thyromegaly. Lungs: Clear to auscultation, nonlabored breathing at rest. Cardiac: Regular rate and rhythm, no S3 or significant systolic murmur, no pericardial rub. Abdomen: Soft, nontender, no hepatomegaly, bowel sounds present, no guarding or rebound. Extremities: No pitting edema, distal pulses 2+. Skin: Warm and  dry. Musculoskeletal: No kyphosis. Neuropsychiatric: Alert and oriented x3, affect grossly appropriate.  ECG:  An ECG dated 10/31/21 was personally reviewed today and demonstrated:  Sinus tachycardia  Recent Labwork: 10/31/2021: B Natriuretic Peptide 4.0; TSH 1.112 08/03/2022: ALT 13; AST 20; BUN 11; Creatinine, Ser 0.77; Hemoglobin 12.8; Magnesium 1.9; Platelets 464; Potassium 3.5; Sodium 134  No results found for: "CHOL", "TRIG", "HDL", "CHOLHDL", "VLDL", "LDLCALC", "LDLDIRECT"  Other Studies Reviewed Today: Event monitor in 11/2021 Normal sinus rhythm with average HR 92 bpm.  Patient events correlated with sinus rhythm high 90s and sinus tachycardia most of the time.  No arrhythmias.   Holter monitor in 2019 24 hr holter monitor Min HR 59, Max HR 159, Avg HR 93 No supreventricular ectopy Occasisional ventricular ectopy, 6% burden. Primarily in the the form of singlets, couplts, bigeminy. No diary submitted No significant arrhythmias    Assessment and Plan: Patient is a 42 year old F with no significant PMH presented to cardiology clinic for follow-up visit.  # Inappropriate sinus tachycardia, possibly  -Orthostatic vitals from 11/2021 were negative for orthostatic hypotension and POTS. Event monitor from 11/23 showed average HR 92 bpm and symptoms correlating with high 90s and sinus tachycardia. Labs including CBC, BMP, TSH, iron panel, cortisol were within normal limits. Metoprolol tartrate 25 mg twice daily was started with significant improvement in symptoms but she had recent ER visit in 7/24 she was noted to have ventricular bigeminy.  Metoprolol tartarate dose was increased from 25 mg to 37.5 mg twice daily with return of palpitations (especially when HR is low).  Will decrease the dose of metoprolol tartrate from 37.5 mg to 25 mg twice daily as HR is 39 bpm and it has been around 30-40s on 37.5 mg dose.  I have spent a total of 20 minutes with patient reviewing chart ,  telemetry, EKGs, labs and examining patient as well as establishing an assessment and plan that was discussed with the patient.  > 50% of time was spent in direct patient care.     Medication Adjustments/Labs and Tests Ordered: Current medicines are reviewed at length with the patient today.  Concerns regarding medicines are outlined above.   Tests Ordered: No orders of the defined types were placed in this encounter.   Medication Changes: Meds ordered this encounter  Medications   metoprolol tartrate (LOPRESSOR) 25 MG tablet    Sig: Take 1 tablet (25 mg total) by mouth 2 (two) times daily.    Dispense:  180 tablet    Refill:  3    08/21/2022-Dose decrease    Disposition:  Follow up  one year  Signed, Trevonne Nyland Verne Spurr, MD, 08/21/2022 12:53 PM    [facility] at [facility] [address] [place], [place] [address]

## 2022-08-30 ENCOUNTER — Encounter: Payer: Self-pay | Admitting: Internal Medicine

## 2022-09-02 ENCOUNTER — Other Ambulatory Visit: Payer: Self-pay

## 2022-09-02 DIAGNOSIS — I4711 Inappropriate sinus tachycardia, so stated: Secondary | ICD-10-CM

## 2022-10-04 ENCOUNTER — Institutional Professional Consult (permissible substitution): Payer: BC Managed Care – PPO | Admitting: Cardiovascular Disease

## 2022-11-14 ENCOUNTER — Ambulatory Visit: Payer: BC Managed Care – PPO | Attending: Cardiovascular Disease | Admitting: Cardiovascular Disease

## 2022-11-15 ENCOUNTER — Encounter: Payer: Self-pay | Admitting: Cardiovascular Disease

## 2022-12-17 ENCOUNTER — Ambulatory Visit: Payer: BC Managed Care – PPO | Attending: Cardiovascular Disease | Admitting: Cardiovascular Disease

## 2022-12-17 NOTE — Progress Notes (Deleted)
  Electrophysiology Office Note:    Date:  12/17/2022   ID:  [name], DOB 06/14/1980, MRN [id number]  PCP:  Selinda Flavin, MD   [facility] Providers Cardiologist:  Marjo Bicker, MD { Click to update primary MD,subspecialty MD or APP then REFRESH:1}    Referring MD: Marjo Bicker, MD   History of Present Illness:    Samantha Fox is a 42 y.o. female with a medical history significant for anxiety and depression, referred for evaluation of palpitations.     I discussed the use of AI scribe software for clinical note transcription with the patient, who gave verbal consent to proceed.  ***     Today, ***  EKGs/Labs/Other Studies Reviewed Today:     Echocardiogram:  *** ***   Monitors:  7 day Zio 10/2021 -- my interpretation Sinus rhythm HR 61-156, avg 92 There was a single 4 beat ventricular run.   Stress testing:  *** ***  Advanced imaging:  *** ***  Cardiac catherization  *** ***  EKG:         Physical Exam:    VS:  There were no vitals taken for this visit.    Wt Readings from Last 3 Encounters:  08/21/22 162 lb 3.2 oz (73.6 kg)  08/03/22 163 lb (73.9 kg)  02/01/22 129 lb 12.8 oz (58.9 kg)     GEN: *** Well nourished, well developed in no acute distress CARDIAC: ***RRR, no murmurs, rubs, gallops RESPIRATORY:  Normal work of breathing MUSCULOSKELETAL: *** edema    ASSESSMENT & PLAN:     *** ***  *** ***  *** ***  *** ***  *** ***    Signed, Maurice Small, MD  12/17/2022 12:24 PM    [facility]
